# Patient Record
Sex: Male | Born: 1970
Health system: Southern US, Community
[De-identification: ages and names within clinical notes are randomized; demographics above are authoritative.]

## PROBLEM LIST (undated history)

## (undated) DIAGNOSIS — IMO0002 Reserved for concepts with insufficient information to code with codable children: Secondary | ICD-10-CM

## (undated) DIAGNOSIS — R229 Localized swelling, mass and lump, unspecified: Secondary | ICD-10-CM

## (undated) DIAGNOSIS — C801 Malignant (primary) neoplasm, unspecified: Secondary | ICD-10-CM

## (undated) DIAGNOSIS — K219 Gastro-esophageal reflux disease without esophagitis: Secondary | ICD-10-CM

## (undated) HISTORY — DX: Localized swelling, mass and lump, unspecified: R22.9

## (undated) HISTORY — DX: Malignant (primary) neoplasm, unspecified: C80.1

## (undated) HISTORY — PX: EYE SURGERY: SHX253

## (undated) HISTORY — DX: Reserved for concepts with insufficient information to code with codable children: IMO0002

## (undated) HISTORY — DX: Gastro-esophageal reflux disease without esophagitis: K21.9

---

## 1992-06-16 HISTORY — PX: INGUINAL HERNIA REPAIR: SUR1180

## 2007-06-17 DIAGNOSIS — C801 Malignant (primary) neoplasm, unspecified: Secondary | ICD-10-CM

## 2007-06-17 HISTORY — PX: EYE SURGERY: SHX253

## 2007-06-17 HISTORY — DX: Malignant (primary) neoplasm, unspecified: C80.1

## 2011-08-12 ENCOUNTER — Other Ambulatory Visit: Payer: Self-pay | Admitting: Unknown Physician Specialty

## 2011-08-12 ENCOUNTER — Ambulatory Visit
Admission: RE | Admit: 2011-08-12 | Discharge: 2011-08-12 | Disposition: A | Discharge status: Home / Self Care | Payer: Self-pay | Source: Ambulatory Visit | Attending: Unknown Physician Specialty | Admitting: Unknown Physician Specialty

## 2011-08-12 DIAGNOSIS — IMO0002 Reserved for concepts with insufficient information to code with codable children: Secondary | ICD-10-CM

## 2011-08-14 ENCOUNTER — Encounter (INDEPENDENT_AMBULATORY_CARE_PROVIDER_SITE_OTHER): Payer: Self-pay | Admitting: General Surgery

## 2011-08-14 ENCOUNTER — Ambulatory Visit (INDEPENDENT_AMBULATORY_CARE_PROVIDER_SITE_OTHER): Payer: BC Managed Care – PPO | Admitting: General Surgery

## 2011-08-14 VITALS — BP 122/88 | HR 70 | Temp 97.9°F | Resp 18 | Ht 71.0 in | Wt 170.8 lb

## 2011-08-14 DIAGNOSIS — R222 Localized swelling, mass and lump, trunk: Secondary | ICD-10-CM

## 2011-08-14 NOTE — Patient Instructions (Signed)
You will be scheduled for excision of the small nodule of your left chest wall in the near future. We will do this under local anesthesia.

## 2011-08-14 NOTE — Progress Notes (Signed)
Patient ID: Darius Fox, male   DOB: [birth year], 41 y.o.   MRN: [id number]  Chief Complaint  Patient presents with  . Mass    Chest wall    HPI Darius Fox is a 41 y.o. male.  He is referred by Dr. Theadora Rama at [facility] forevaluation of a soft tissue mass of the left chest wall.  Significant history is that the patient was treated for melanoma of the right eye in [place] 2009. He received proton beam therapy and has lost 95% of his vision on the right. Recent eye exam one month ago in [place] was normal. He gets periodic chest x-ray, ultrasound of liver, and MRI of brain and these have all been normal.  2 weeks ago he noticed a tender slightly reddened nodule of his left chest wall. He took antibiotics and states that it is less tender and less swollen now, but still present and firm.. A chest x-ray was done which was normal. Because of his history of melanoma he is very concerned this could be a metastasis. HPI  Past Medical History  Diagnosis Date  . GERD (gastroesophageal reflux disease)   . Cancer 2009    eye - right (below retina)  . Mass     chest wall    Past Surgical History  Procedure Date  . Eye surgery 2009    due to cancer  . Hernia repair 1994    Family History  Problem Relation Age of Onset  . Cancer Paternal Grandfather     prostate    Social History History  Substance Use Topics  . Smoking status: Never Smoker   . Smokeless tobacco: Never Used  . Alcohol Use: Yes     occasional 2 -3 per week    No Known Allergies  Current Outpatient Prescriptions  Medication Sig Dispense Refill  . esomeprazole (NEXIUM) 40 MG capsule Take 40 mg by mouth as needed.        Review of Systems Review of Systems  Constitutional: Negative for fever, chills and unexpected weight change.  HENT: Negative for hearing loss, congestion, sore throat, trouble swallowing and voice change.   Eyes: Positive for visual disturbance.  Respiratory:  Negative for cough and wheezing.   Cardiovascular: Negative for chest pain, palpitations and leg swelling.  Gastrointestinal: Negative for nausea, vomiting, abdominal pain, diarrhea, constipation, blood in stool, abdominal distention, anal bleeding and rectal pain.  Genitourinary: Negative for hematuria and difficulty urinating.  Musculoskeletal: Negative for arthralgias.  Skin: Negative for rash and wound.  Neurological: Negative for seizures, syncope, weakness and headaches.  Hematological: Negative for adenopathy. Does not bruise/bleed easily.  Psychiatric/Behavioral: Negative for confusion.    Blood pressure 122/88, pulse 70, temperature 97.9 F (36.6 C), temperature source Temporal, resp. rate 18, height 5\' 11"  (1.803 m), weight 170 lb 12.8 oz (77.474 kg).  Physical Exam Physical Exam  Constitutional: He is oriented to person, place, and time. He appears well-developed and well-nourished. No distress.  HENT:  Head: Normocephalic.  Nose: Nose normal.  Eyes: Conjunctivae and EOM are normal. Pupils are equal, round, and reactive to light. Right eye exhibits no discharge. Left eye exhibits no discharge. No scleral icterus.       Vision not tested.  Neck: Normal range of motion. Neck supple. No JVD present. No tracheal deviation present. No thyromegaly present.  Cardiovascular: Normal rate, regular rhythm, normal heart sounds and intact distal pulses.   No murmur heard. Pulmonary/Chest: Effort normal and breath sounds normal. No  stridor. No respiratory distress. He has no wheezes. He has no rales. He exhibits no tenderness.  Musculoskeletal: Normal range of motion. He exhibits no edema and no tenderness.  Lymphadenopathy:    He has no cervical adenopathy.  Neurological: He is alert and oriented to person, place, and time. He has normal reflexes. Coordination normal.  Skin: Skin is warm and dry. No rash noted. He is not diaphoretic. No erythema. No pallor.       There is an 8-10 cm  firm, subcutaneous nodule at the left lateral chest wall overlying the lower rib cage. Minimal skin discoloration. This is mobile and not fixed to the chest wall. No adenopathy.  Psychiatric: He has a normal mood and affect. His behavior is normal. Judgment and thought content normal.    Data Reviewed I reviewed his chest x-ray report and some handwritten medical notes from [facility].  Assessment    Soft tissue mass left chest wall. History is more consistent with inflammatory rather than neoplastic process. However, considering the history of melanoma this area should be excised for histologic confirmation.  History melanoma right eye, treated with proton beam therapy 2009    Plan    Schedule for excision of left chest wall mass under local anesthesia at [facility] day surgery in the very near future.  I have discussed the indications and details and risks of the surgery with the patient. He understands these issues. His questions are answered. He agrees with this plan.       Angelia Mould. Derrell Lolling, M.D., [facility] General and Minimally invasive Surgery Breast and Colorectal Surgery Office:   [phone] Pager:   [phone]  08/14/2011, 11:36 AM

## 2011-08-28 NOTE — H&P (Signed)
Darius Fox     MRN: [id number]   Description: 41 year old male  Provider: Ernestene Mention, MD  Department: [facility]        Diagnoses     Mass of chest wall   - Primary    786.6   Vitals     BP Pulse Temp(Src) Resp Ht Wt    122/88  70  97.9 F (36.6 C) (Temporal)  18  5\' 11"  (1.803 m)  170 lb 12.8 oz (77.474 kg)    BMI - 23.82 kg/m2                 History and Physical     Ernestene Mention, MD  Patient ID: Darius Fox, male   DOB: [birth year], 41 y.o.   MRN: [id number]       HPI Darius Fox is a 41 y.o. male.  He is referred by Dr. Theadora Rama at [facility] forevaluation of a soft tissue mass of the left chest wall.  Significant history is that the patient was treated for melanoma of the right eye in [place] 2009. He received proton beam therapy and has lost 95% of his vision on the right. Recent eye exam one month ago in [place] was normal. He gets periodic chest x-ray, ultrasound of liver, and MRI of brain and these have all been normal.  2 weeks ago he noticed a tender slightly reddened nodule of his left chest wall. He took antibiotics and states that it is less tender and less swollen now, but still present and firm.. A chest x-ray was done which was normal. Because of his history of melanoma he is very concerned this could be a metastasis.     Past Medical History   Diagnosis  Date   .  GERD (gastroesophageal reflux disease)     .  Cancer  2009       eye - right (below retina)   .  Mass         chest wall       Past Surgical History   Procedure  Date   .  Eye surgery  2009       due to cancer   .  Hernia repair  1994       Family History   Problem  Relation  Age of Onset   .  Cancer  Paternal Grandfather         prostate      Social History History   Substance Use Topics   .  Smoking status:  Never Smoker    .  Smokeless tobacco:  Never Used   .  Alcohol Use:  Yes         occasional 2 -3 per week       No Known Allergies    Current Outpatient Prescriptions   Medication  Sig  Dispense  Refill   .  esomeprazole (NEXIUM) 40 MG capsule  Take 40 mg by mouth as needed.            Review of Systems   Constitutional: Negative for fever, chills and unexpected weight change.  HENT: Negative for hearing loss, congestion, sore throat, trouble swallowing and voice change.   Eyes: Positive for visual disturbance.  Respiratory: Negative for cough and wheezing.   Cardiovascular: Negative for chest pain, palpitations and leg swelling.  Gastrointestinal: Negative for nausea, vomiting, abdominal pain, diarrhea, constipation, blood in stool, abdominal distention, anal bleeding and rectal pain.  Genitourinary: Negative for hematuria and difficulty urinating.  Musculoskeletal: Negative for arthralgias.  Skin: Negative for rash and wound.  Neurological: Negative for seizures, syncope, weakness and headaches.  Hematological: Negative for adenopathy. Does not bruise/bleed easily.  Psychiatric/Behavioral: Negative for confusion.    Blood pressure 122/88, pulse 70, temperature 97.9 F (36.6 C), temperature source Temporal, resp. rate 18, height 5\' 11"  (1.803 m), weight 170 lb 12.8 oz (77.474 kg).   Physical Exam  Constitutional: He is oriented to person, place, and time. He appears well-developed and well-nourished. No distress.  HENT:   Head: Normocephalic.   Nose: Nose normal.  Eyes: Conjunctivae and EOM are normal. Pupils are equal, round, and reactive to light. Right eye exhibits no discharge. Left eye exhibits no discharge. No scleral icterus.       Vision not tested.  Neck: Normal range of motion. Neck supple. No JVD present. No tracheal deviation present. No thyromegaly present.  Cardiovascular: Normal rate, regular rhythm, normal heart sounds and intact distal pulses.    No murmur heard. Pulmonary/Chest: Effort normal and breath sounds normal. No stridor. No respiratory distress. He has  no wheezes. He has no rales. He exhibits no tenderness.  Musculoskeletal: Normal range of motion. He exhibits no edema and no tenderness.  Lymphadenopathy:    He has no cervical adenopathy.  Neurological: He is alert and oriented to person, place, and time. He has normal reflexes. Coordination normal.  Skin: Skin is warm and dry. No rash noted. He is not diaphoretic. No erythema. No pallor.       There is an 8-10 cm firm, subcutaneous nodule at the left lateral chest wall overlying the lower rib cage. Minimal skin discoloration. This is mobile and not fixed to the chest wall. No adenopathy.  Psychiatric: He has a normal mood and affect. His behavior is normal. Judgment and thought content normal.    Data Reviewed I reviewed his chest x-ray report and some handwritten medical notes from [facility].   Assessment Soft tissue mass left chest wall. History is more consistent with inflammatory rather than neoplastic process. However, considering the history of melanoma this area should be excised for histologic confirmation.  History melanoma right eye, treated with proton beam therapy 2009   Plan Schedule for excision of left chest wall mass under local anesthesia at [facility] day surgery in the very near future.  I have discussed the indications and details and risks of the surgery with the patient. He understands these issues. His questions are answered. He agrees with this plan.       Angelia Mould. Derrell Lolling, M.D., [facility] General and Minimally invasive Surgery Breast and Colorectal Surgery Office:   [phone] Pager:   [phone]

## 2011-08-29 ENCOUNTER — Ambulatory Visit (HOSPITAL_BASED_OUTPATIENT_CLINIC_OR_DEPARTMENT_OTHER)
Admission: RE | Admit: 2011-08-29 | Discharge: 2011-08-29 | Disposition: A | Discharge status: Home / Self Care | Payer: BC Managed Care – PPO | Source: Ambulatory Visit | Attending: General Surgery | Admitting: General Surgery

## 2011-08-29 ENCOUNTER — Encounter (HOSPITAL_BASED_OUTPATIENT_CLINIC_OR_DEPARTMENT_OTHER)
Admission: RE | Disposition: A | Discharge status: Home / Self Care | Payer: Self-pay | Source: Ambulatory Visit | Attending: General Surgery

## 2011-08-29 ENCOUNTER — Encounter (HOSPITAL_BASED_OUTPATIENT_CLINIC_OR_DEPARTMENT_OTHER): Payer: Self-pay | Admitting: *Deleted

## 2011-08-29 DIAGNOSIS — K219 Gastro-esophageal reflux disease without esophagitis: Secondary | ICD-10-CM | POA: Insufficient documentation

## 2011-08-29 DIAGNOSIS — M799 Soft tissue disorder, unspecified: Secondary | ICD-10-CM | POA: Insufficient documentation

## 2011-08-29 DIAGNOSIS — D367 Benign neoplasm of other specified sites: Secondary | ICD-10-CM

## 2011-08-29 DIAGNOSIS — Z85820 Personal history of malignant melanoma of skin: Secondary | ICD-10-CM | POA: Insufficient documentation

## 2011-08-29 HISTORY — PX: MASS EXCISION: SHX2000

## 2011-08-29 SURGERY — MINOR EXCISION OF MASS
Anesthesia: LOCAL | Site: Chest | Laterality: Left | Wound class: Clean

## 2011-08-29 MED ORDER — HYDROCODONE-ACETAMINOPHEN 5-325 MG PO TABS: 1.0000 | ORAL_TABLET | ORAL | Status: AC | PRN

## 2011-08-29 MED ORDER — SODIUM BICARBONATE 4 % IV SOLN
INTRAVENOUS | Status: DC | PRN
  Administered 2011-08-29: 09:00:00 via SUBCUTANEOUS

## 2011-08-29 SURGICAL SUPPLY — 36 items
BENZOIN TINCTURE PRP APPL 2/3 (GAUZE/BANDAGES/DRESSINGS) IMPLANT
BLADE SURG 15 STRL LF DISP TIS (BLADE) ×1 IMPLANT
BLADE SURG 15 STRL SS (BLADE) ×1
CLOTH BEACON ORANGE TIMEOUT ST (SAFETY) IMPLANT
COTTONBALL LRG STERILE PKG (GAUZE/BANDAGES/DRESSINGS) IMPLANT
DERMABOND ADVANCED (GAUZE/BANDAGES/DRESSINGS) ×1
DERMABOND ADVANCED .7 DNX12 (GAUZE/BANDAGES/DRESSINGS) ×1 IMPLANT
DRAPE SURG 17X23 STRL (DRAPES) IMPLANT
ELECT REM PT RETURN 9FT ADLT (ELECTROSURGICAL)
ELECTRODE REM PT RTRN 9FT ADLT (ELECTROSURGICAL) IMPLANT
GAUZE SPONGE 4X4 12PLY STRL LF (GAUZE/BANDAGES/DRESSINGS) IMPLANT
GLOVE ECLIPSE 6.5 STRL STRAW (GLOVE) ×4 IMPLANT
GLOVE EUDERMIC 7 POWDERFREE (GLOVE) ×2 IMPLANT
MARKER SKIN DUAL TIP RULER LAB (MISCELLANEOUS) ×2 IMPLANT
NDL SAFETY ECLIPSE 18X1.5 (NEEDLE) ×1 IMPLANT
NEEDLE HYPO 18GX1.5 SHARP (NEEDLE) ×1
NEEDLE HYPO 25X1 1.5 SAFETY (NEEDLE) ×2 IMPLANT
NS IRRIG 1000ML POUR BTL (IV SOLUTION) ×2 IMPLANT
PENCIL BUTTON HOLSTER BLD 10FT (ELECTRODE) IMPLANT
STRIP CLOSURE SKIN 1/2X4 (GAUZE/BANDAGES/DRESSINGS) IMPLANT
SUT MNCRL AB 3-0 PS2 18 (SUTURE) IMPLANT
SUT MON AB 4-0 PC3 18 (SUTURE) ×2 IMPLANT
SUT SILK 3 0 TIES 17X18 (SUTURE)
SUT SILK 3-0 18XBRD TIE BLK (SUTURE) IMPLANT
SUT VIC AB 3-0 SH 27 (SUTURE) ×1
SUT VIC AB 3-0 SH 27X BRD (SUTURE) ×1 IMPLANT
SUT VIC AB 4-0 P-3 18XBRD (SUTURE) IMPLANT
SUT VIC AB 4-0 P3 18 (SUTURE)
SUT VIC AB 5-0 P-3 18X BRD (SUTURE) IMPLANT
SUT VIC AB 5-0 P3 18 (SUTURE)
SUT VICRYL 3-0 CR8 SH (SUTURE) IMPLANT
SUT VICRYL AB 3 0 TIES (SUTURE) IMPLANT
SWABSTICK POVIDONE IODINE SNGL (MISCELLANEOUS) ×4 IMPLANT
SYR CONTROL 10ML LL (SYRINGE) ×2 IMPLANT
TOWEL OR 17X24 6PK STRL BLUE (TOWEL DISPOSABLE) IMPLANT
TRAY DSU PREP LF (CUSTOM PROCEDURE TRAY) IMPLANT

## 2011-08-29 NOTE — Discharge Instructions (Signed)
Keep the wound clean and dry for 36 hours. Thereafter you may take a quick shower. No swimming or submersion for 2 weeks.  You may drive a car. He may return to work. Avoid strenuous sports or any significant impact for 2 weeks.  Call Dr. Derrell Lolling at the the office([phone]) and make an appointment to see Dr. Derrell Lolling in 2 weeks.  We should be able to call for pathology report to you by next Tuesday.

## 2011-08-29 NOTE — Interval H&P Note (Signed)
History and Physical Interval Note:  08/29/2011 8:35 AM  Darius Fox  has presented today for surgery, with the diagnosis of left chest wall mass   The goals of treatment and the  various methods of treatment have been discussed with the patient and family. After consideration of risks, benefits and other options for treatment, the patient has consented to  Procedure(s) (LRB): MINOR EXCISION OF MASS LEFT CHEST WALL (Left) as a surgical intervention .  The patients' history has been reviewed today, patient examined today, no change in status, stable for surgery.  I have reviewed the patients' chart and labs.  Questions were answered to the patient's satisfaction.     Ernestene Mention

## 2011-08-29 NOTE — Op Note (Signed)
Patient Name:           Darius Fox   Date of Surgery:        08/29/2011  Pre op Diagnosis:      Soft tissue mass left chest wall  Post op Diagnosis:    Same   Procedure:                 Kris Hartmann is 1 cm soft tissue mass left chest wall, subcutaneous, layered closure.  Surgeon:                     Angelia Mould. Derrell Lolling, M.D., FACS  Assistant:                      none  Operative Indications:   Significant history is that the patient was treated for melanoma of the right eye in [place] 2009. He received proton beam therapy and has lost 95% of his vision on the right. Recent eye exam one month ago in [place] was normal. He gets periodic chest x-ray, ultrasound of liver, and MRI of brain and these have all been normal.  2 weeks ago he noticed a tender slightly reddened nodule of his left chest wall. He took antibiotics and states that it is less tender and less swollen now, but still present and firm.. A chest x-ray was done which was normal. Because of his history of melanoma he is very concerned this could be a metastasis. On exam he has 81 cm subcutaneous nodule that is white firm. The overlying skin skin has a minimal discoloration. The mass is mobile.   Operative Findings:       There was a small firm nodule in the subcutaneous tissue.  Procedure in Detail:          The patient was brought to room 4 at [facility]. The procedure was done under local anesthesia. The patient was placed in the right lateral decubitus position exposing the left chest wall. Surgical time out was performed. The left chest wall was prepped and draped in a sterile fashion. Marking pen was used to mark the location of the nodule then to draw a transverse elliptical incision. 1% Xylocaine with epinephrine was used as local infiltration anesthetic. Transverse elliptical incision was made around the nodule. The dissection was taken down all the way to the muscle fascia. The nodule was removed and sent to  pathology with the history attached. Hemostasis excellent. Subcutaneous tissue closed with 3-0 Vicryl sutures and skin closed with running subcuticular suture of 4-0 Monocryl and Dermabond. Patient tolerated procedure well. There were no complications. EBL 5 cc. Counts correct.     Angelia Mould. Derrell Lolling, M.D., FACS General and Minimally Invasive Surgery Breast and Colorectal Surgery  08/29/2011 8:59 AM

## 2011-09-01 ENCOUNTER — Encounter (HOSPITAL_BASED_OUTPATIENT_CLINIC_OR_DEPARTMENT_OTHER): Payer: Self-pay | Admitting: General Surgery

## 2011-09-02 ENCOUNTER — Telehealth (INDEPENDENT_AMBULATORY_CARE_PROVIDER_SITE_OTHER): Payer: Self-pay

## 2011-09-02 NOTE — Telephone Encounter (Signed)
LMOM for pt to call. I have path result available.

## 2011-09-03 ENCOUNTER — Telehealth (INDEPENDENT_AMBULATORY_CARE_PROVIDER_SITE_OTHER): Payer: Self-pay

## 2011-09-03 NOTE — Telephone Encounter (Signed)
Pt returned call. Pt advised path benign. Pt to keep po appt.

## 2011-09-08 ENCOUNTER — Ambulatory Visit (INDEPENDENT_AMBULATORY_CARE_PROVIDER_SITE_OTHER): Payer: BC Managed Care – PPO | Admitting: General Surgery

## 2011-09-08 ENCOUNTER — Encounter (INDEPENDENT_AMBULATORY_CARE_PROVIDER_SITE_OTHER): Payer: Self-pay | Admitting: General Surgery

## 2011-09-08 VITALS — BP 114/70 | HR 66 | Temp 96.8°F | Resp 18 | Ht 71.0 in | Wt 168.6 lb

## 2011-09-08 DIAGNOSIS — Z9889 Other specified postprocedural states: Secondary | ICD-10-CM

## 2011-09-08 NOTE — Progress Notes (Signed)
Subjective:     Patient ID: Darius Fox, male   DOB: 02/04/1971, 41 y.o.   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 Final pathology report showed benign fibrosis. Traumatic etiology is favored. Nothing further will need to be done. I gave him a copy of his pathology report and discussed this with him. He has no problems with wound healing.  Review of Systems     Objective:   Physical Exam The patient looks well. No distress.  Left chest wall incision is healing uneventfully. No wound complications.   Assessment:     Benign fibrotic nodule left chest wall, recovering uneventfully following excisional biopsy.  History of ocular melanoma.    Plan:     The patient was given a copy of his pathology report.  Wound care discussed.  Return to see me p.r.n.   Angelia Mould. Derrell Lolling, M.D., [facility] General and Minimally invasive Surgery Breast and Colorectal Surgery Office:   [phone] Pager:   [phone]

## 2011-09-08 NOTE — Patient Instructions (Signed)
The final pathology report from the biopsy of your left chest wall shows a benign scarring or fibrosis. There is no evidence of any malignancy.  Return to see Dr. Derrell Lolling if any new problems arise.

## 2012-07-23 ENCOUNTER — Other Ambulatory Visit: Payer: Self-pay | Admitting: Family Medicine

## 2012-07-23 DIAGNOSIS — C439 Malignant melanoma of skin, unspecified: Secondary | ICD-10-CM

## 2012-08-04 ENCOUNTER — Ambulatory Visit
Admission: RE | Admit: 2012-08-04 | Discharge: 2012-08-04 | Disposition: A | Discharge status: Home / Self Care | Payer: BC Managed Care – PPO | Source: Ambulatory Visit | Attending: Family Medicine | Admitting: Family Medicine

## 2012-08-04 DIAGNOSIS — C439 Malignant melanoma of skin, unspecified: Secondary | ICD-10-CM

## 2013-07-28 ENCOUNTER — Encounter: Payer: Self-pay | Admitting: Gastroenterology

## 2013-08-30 ENCOUNTER — Encounter: Payer: Self-pay | Admitting: Gastroenterology

## 2013-08-30 ENCOUNTER — Ambulatory Visit (INDEPENDENT_AMBULATORY_CARE_PROVIDER_SITE_OTHER): Payer: BC Managed Care – PPO | Admitting: Gastroenterology

## 2013-08-30 VITALS — BP 110/76 | HR 76 | Ht 71.0 in | Wt 179.1 lb

## 2013-08-30 DIAGNOSIS — K219 Gastro-esophageal reflux disease without esophagitis: Secondary | ICD-10-CM

## 2013-08-30 NOTE — Progress Notes (Signed)
HPI: This is a  very pleasant 43 year old man born in [place] who lives here, works for [employer].    He has had chronic heartburn for 20 years or so. He had an upper endoscopy 6 years ago when he was first diagnosed with ocular melanoma he tells me he was normal. He takes proton pump inhibitor once daily and on that regimen, which is an over-the-counter strength Nexium, his symptoms are completely controlled without any alarm symptoms. He has no dysphasia, no overt, no unexplained weight loss. His GERD symptoms that typical of pyrosis.    Review of systems: Pertinent positive and negative review of systems were noted in the above HPI section. Complete review of systems was performed and was otherwise normal.    Past Medical History  Diagnosis Date  . GERD (gastroesophageal reflux disease)   . Cancer 2009    eye - right (below retina)  . Mass     chest wall    Past Surgical History  Procedure Laterality Date  . Eye surgery  2009    due to cancer  . Inguinal hernia repair Right 1994  . Mass excision  08/29/2011    Procedure: MINOR EXCISION OF MASS;  Surgeon: Adin Hector, MD;  Location: [facility];  Service: General;  Laterality: Left;  local excision of left chest wall mass     Current Outpatient Prescriptions  Medication Sig Dispense Refill  . esomeprazole (NEXIUM) 20 MG capsule Take 20 mg by mouth daily at 12 noon.       No current facility-administered medications for this visit.    Allergies as of 08/30/2013  . (No Known Allergies)    Family History  Problem Relation Age of Onset  . Prostate cancer Paternal Grandfather   . Diabetes Mother   . Hypertension Father     History   Social History  . Marital Status: Married    Spouse Name: N/A    Number of Children: 1  . Years of Education: N/A   Occupational History  . Director of [employer]    Social History Main Topics  . Smoking status: Never Smoker   . Smokeless tobacco:  Never Used  . Alcohol Use: Yes     Comment: occasional 2 -3 per week  . Drug Use: No  . Sexual Activity: Not on file   Other Topics Concern  . Not on file   Social History Narrative  . No narrative on file       Physical Exam: BP 110/76  Pulse 76  Ht 5\' 11"  (1.803 m)  Wt 179 lb 2 oz (81.251 kg)  BMI 24.99 kg/m2 Constitutional: generally well-appearing Psychiatric: alert and oriented x3 Eyes: extraocular movements intact Mouth: oral pharynx moist, no lesions Neck: supple no lymphadenopathy Cardiovascular: heart regular rate and rhythm Lungs: clear to auscultation bilaterally Abdomen: soft, nontender, nondistended, no obvious ascites, no peritoneal signs, normal bowel sounds Extremities: no lower extremity edema bilaterally Skin: no lesions on visible extremities    Assessment and plan: 43 y.o. male with  chronic GERD without alarm symptoms in a nonsmoking male   He will continue on proton pump inhibitor once daily. He knows to call if this ceases to control his symptoms. He has no alarm symptoms currently. He had a colonoscopy 6 years ago, this was around the time of being diagnosed with ocular melanoma. He will be turning 48 in about 5 a half years and we will put him in our reminder system  for recall colonoscopy around that time for routine risk.

## 2013-08-30 NOTE — Patient Instructions (Signed)
Continue taking nexium (PPI) once daily for your GERD symptoms. Colonoscopy for colon cancer screening at age 43, recall colonoscopy 5 years.

## 2013-11-08 ENCOUNTER — Other Ambulatory Visit: Payer: Self-pay | Admitting: Unknown Physician Specialty

## 2013-11-08 DIAGNOSIS — C439 Malignant melanoma of skin, unspecified: Secondary | ICD-10-CM

## 2013-11-22 ENCOUNTER — Encounter (INDEPENDENT_AMBULATORY_CARE_PROVIDER_SITE_OTHER): Payer: Self-pay

## 2013-11-22 ENCOUNTER — Ambulatory Visit
Admission: RE | Admit: 2013-11-22 | Discharge: 2013-11-22 | Disposition: A | Discharge status: Home / Self Care | Payer: BC Managed Care – PPO | Source: Ambulatory Visit | Attending: Unknown Physician Specialty | Admitting: Unknown Physician Specialty

## 2013-11-22 ENCOUNTER — Other Ambulatory Visit: Payer: Self-pay | Admitting: Unknown Physician Specialty

## 2013-11-22 DIAGNOSIS — C439 Malignant melanoma of skin, unspecified: Secondary | ICD-10-CM

## 2014-02-21 ENCOUNTER — Other Ambulatory Visit (INDEPENDENT_AMBULATORY_CARE_PROVIDER_SITE_OTHER): Payer: Self-pay | Admitting: General Surgery

## 2014-02-21 ENCOUNTER — Encounter (INDEPENDENT_AMBULATORY_CARE_PROVIDER_SITE_OTHER): Payer: BC Managed Care – PPO | Admitting: General Surgery

## 2014-04-09 NOTE — H&P (Signed)
Darius Fox  Location: [facility] Patient #: [id number] DOB: [birth year] Married / Language: English / Race: White Male  History of Present Illness Patient words: Cyst on spine area and shoulder blade.  The patient is a 43 year old male who presents with an epidermal cyst. This gentleman is referred back to me by Dr. Hulan Fess for evaluation and management of 3 separate epidermal cysts on his back. They have been a little uncomfortable but have never been truly infected and had never really drained. They have been getting larger. His wife expressed some material from one of the lower ones. He wants all of these excised. Past history is significant for chronic reflux on Nexium. history of ocular melanoma followed at [facility] no recurrence I excised a benign lesion on his chest wall 3 years ago.   Other Problems  Gastroesophageal Reflux Disease Melanoma  Past Surgical History Open Inguinal Hernia Surgery Right.  Diagnostic Studies History  Colonoscopy 5-10 years ago  Allergies  No Known Drug Allergies09/01/2014  Medication History NexIUM (40MG  Capsule DR, Oral as needed) Active.  Social History Alcohol use Occasional alcohol use. Caffeine use Coffee. No drug use Tobacco use Never smoker.  Family History Hypertension Father.  Review of Systems General Not Present- Appetite Loss, Chills, Fatigue, Fever, Night Sweats, Weight Gain and Weight Loss. Skin Not Present- Change in Wart/Mole, Dryness, Hives, Jaundice, New Lesions, Non-Healing Wounds, Rash and Ulcer. HEENT Present- Ringing in the Ears and Wears glasses/contact lenses. Not Present- Earache, Hearing Loss, Hoarseness, Nose Bleed, Oral Ulcers, Seasonal Allergies, Sinus Pain, Sore Throat, Visual Disturbances and Yellow Eyes. Respiratory Not Present- Bloody sputum, Chronic Cough, Difficulty Breathing, Snoring and Wheezing. Breast Not Present- Breast Mass, Breast Pain, Nipple Discharge and Skin  Changes. Cardiovascular Not Present- Chest Pain, Difficulty Breathing Lying Down, Leg Cramps, Palpitations, Rapid Heart Rate, Shortness of Breath and Swelling of Extremities. Gastrointestinal Not Present- Abdominal Pain, Bloating, Bloody Stool, Change in Bowel Habits, Chronic diarrhea, Constipation, Difficulty Swallowing, Excessive gas, Gets full quickly at meals, Hemorrhoids, Indigestion, Nausea, Rectal Pain and Vomiting. Male Genitourinary Not Present- Blood in Urine, Change in Urinary Stream, Frequency, Impotence, Nocturia, Painful Urination, Urgency and Urine Leakage. Musculoskeletal Not Present- Back Pain, Joint Pain, Joint Stiffness, Muscle Pain, Muscle Weakness and Swelling of Extremities. Neurological Not Present- Decreased Memory, Fainting, Headaches, Numbness, Seizures, Tingling, Tremor, Trouble walking and Weakness. Psychiatric Not Present- Anxiety, Bipolar, Change in Sleep Pattern, Depression, Fearful and Frequent crying. Endocrine Not Present- Cold Intolerance, Excessive Hunger, Hair Changes, Heat Intolerance, Hot flashes and New Diabetes. Hematology Not Present- Easy Bruising, Excessive bleeding, Gland problems, HIV and Persistent Infections.   Vitals 02/21/2014 2:21 PM Weight: 173.2 lb Height: 71in Body Surface Area: 1.98 m Body Mass Index: 24.16 kg/m Temp.: 98.110F(Oral)  Pulse: 80 (Regular)  Resp.: 20 (Unlabored)  BP: 120/84 (Sitting, Left Arm, Standard)    Physical Exam  General Note: Alert. Thin. Healthy gentleman in no distress.   Integumentary Note: There are 3 epidermal cysts on his back. There is a 2 cm cyst in the upper left paraspinal area. There are 2 epidermal cyst in the right mid to lower back, one of these is a little bit reddened but doesn't look obviously infected it is not tender.   Head and Neck Note: No adenopathy or mass. No thyromegaly.   Chest and Lung Exam Note: Lungs are clear to auscultation  bilaterally   Cardiovascular Note: heart reveals regular rate and rhythm. No murmur. No ectopy. Radial pulses palpable.  Musculoskeletal Note: Angulating independently. No gross motor or sensory deficits. Gait normal.     Assessment & Plan  EPIDERMAL CYST (706.2  L72.0) Impression: Excision of these 3 cysts is desired by the patient. He is concerned about enlargement and intermittent discomfort. He will be scheduled for excision of all 3 epidermal cyst of his back under local anesthesia in the near future. Indications, details, technique, and risk were discussed and accepted. Current Plans  Schedule for Surgery  [facility]. Dalbert Batman, M.D., [facility] General and Minimally invasive Surgery Breast and Colorectal Surgery Office:   [phone] Pager:   [phone]

## 2014-04-11 ENCOUNTER — Ambulatory Visit (HOSPITAL_BASED_OUTPATIENT_CLINIC_OR_DEPARTMENT_OTHER)
Admission: RE | Admit: 2014-04-11 | Discharge: 2014-04-11 | Disposition: A | Discharge status: Home / Self Care | Payer: BC Managed Care – PPO | Source: Ambulatory Visit | Attending: General Surgery | Admitting: General Surgery

## 2014-04-11 ENCOUNTER — Encounter (HOSPITAL_BASED_OUTPATIENT_CLINIC_OR_DEPARTMENT_OTHER)
Admission: RE | Disposition: A | Discharge status: Home / Self Care | Payer: Self-pay | Source: Ambulatory Visit | Attending: General Surgery

## 2014-04-11 ENCOUNTER — Encounter (HOSPITAL_BASED_OUTPATIENT_CLINIC_OR_DEPARTMENT_OTHER): Payer: Self-pay | Admitting: *Deleted

## 2014-04-11 ENCOUNTER — Encounter (HOSPITAL_BASED_OUTPATIENT_CLINIC_OR_DEPARTMENT_OTHER): Payer: Self-pay | Admitting: Anesthesiology

## 2014-04-11 DIAGNOSIS — K219 Gastro-esophageal reflux disease without esophagitis: Secondary | ICD-10-CM | POA: Insufficient documentation

## 2014-04-11 DIAGNOSIS — Z85820 Personal history of malignant melanoma of skin: Secondary | ICD-10-CM | POA: Diagnosis not present

## 2014-04-11 DIAGNOSIS — L720 Epidermal cyst: Secondary | ICD-10-CM

## 2014-04-11 HISTORY — PX: MASS EXCISION: SHX2000

## 2014-04-11 SURGERY — MINOR EXCISION OF MASS
Anesthesia: LOCAL | Site: Back

## 2014-04-11 MED ORDER — SODIUM BICARBONATE 4 % IV SOLN
INTRAVENOUS | Status: AC
  Filled 2014-04-11: qty 5

## 2014-04-11 MED ORDER — BUPIVACAINE-EPINEPHRINE (PF) 0.5% -1:200000 IJ SOLN
INTRAMUSCULAR | Status: AC
  Filled 2014-04-11: qty 60

## 2014-04-11 MED ORDER — SODIUM BICARBONATE 4 % IV SOLN
INTRAVENOUS | Status: DC | PRN
  Administered 2014-04-11: 10:00:00 via INTRAMUSCULAR

## 2014-04-11 MED ORDER — CHLORHEXIDINE GLUCONATE 4 % EX LIQD: 1.0000 "application " | Freq: Once | CUTANEOUS | Status: DC

## 2014-04-11 MED ORDER — HYDROCODONE-ACETAMINOPHEN 5-325 MG PO TABS: 1.0000 | ORAL_TABLET | Freq: Four times a day (QID) | ORAL | Status: DC | PRN

## 2014-04-11 SURGICAL SUPPLY — 38 items
BENZOIN TINCTURE PRP APPL 2/3 (GAUZE/BANDAGES/DRESSINGS) IMPLANT
BLADE SURG 15 STRL LF DISP TIS (BLADE) ×1 IMPLANT
BLADE SURG 15 STRL SS (BLADE) ×1
COTTONBALL LRG STERILE PKG (GAUZE/BANDAGES/DRESSINGS) IMPLANT
DRAPE SURG 17X23 STRL (DRAPES) IMPLANT
ELECT REM PT RETURN 9FT ADLT (ELECTROSURGICAL)
ELECTRODE REM PT RTRN 9FT ADLT (ELECTROSURGICAL) IMPLANT
GLOVE BIO SURGEON STRL SZ 6.5 (GLOVE) ×2 IMPLANT
GLOVE BIOGEL PI IND STRL 7.0 (GLOVE) ×1 IMPLANT
GLOVE BIOGEL PI INDICATOR 7.0 (GLOVE) ×1
GLOVE EUDERMIC 7 POWDERFREE (GLOVE) ×2 IMPLANT
GOWN STRL REUS W/ TWL LRG LVL3 (GOWN DISPOSABLE) ×1 IMPLANT
GOWN STRL REUS W/ TWL XL LVL3 (GOWN DISPOSABLE) ×1 IMPLANT
GOWN STRL REUS W/TWL LRG LVL3 (GOWN DISPOSABLE) ×1
GOWN STRL REUS W/TWL XL LVL3 (GOWN DISPOSABLE) ×1
LIQUID BAND (GAUZE/BANDAGES/DRESSINGS) ×2 IMPLANT
MARKER SKIN DUAL TIP RULER LAB (MISCELLANEOUS) ×2 IMPLANT
NDL SAFETY ECLIPSE 18X1.5 (NEEDLE) IMPLANT
NEEDLE HYPO 18GX1.5 SHARP (NEEDLE)
NEEDLE HYPO 25X1 1.5 SAFETY (NEEDLE) ×2 IMPLANT
NS IRRIG 1000ML POUR BTL (IV SOLUTION) IMPLANT
PENCIL BUTTON HOLSTER BLD 10FT (ELECTRODE) IMPLANT
SPONGE GAUZE 4X4 12PLY STER LF (GAUZE/BANDAGES/DRESSINGS) IMPLANT
STRIP CLOSURE SKIN 1/2X4 (GAUZE/BANDAGES/DRESSINGS) IMPLANT
SUT MNCRL AB 3-0 PS2 18 (SUTURE) IMPLANT
SUT MNCRL AB 4-0 PS2 18 (SUTURE) ×2 IMPLANT
SUT SILK 3 0 TIES 17X18 (SUTURE)
SUT SILK 3-0 18XBRD TIE BLK (SUTURE) IMPLANT
SUT VIC AB 4-0 P-3 18XBRD (SUTURE) IMPLANT
SUT VIC AB 4-0 P3 18 (SUTURE)
SUT VIC AB 5-0 P-3 18X BRD (SUTURE) IMPLANT
SUT VIC AB 5-0 P3 18 (SUTURE)
SUT VICRYL 3-0 CR8 SH (SUTURE) ×2 IMPLANT
SUT VICRYL AB 3 0 TIES (SUTURE) IMPLANT
SWABSTICK POVIDONE IODINE SNGL (MISCELLANEOUS) IMPLANT
SYRINGE 10CC LL (SYRINGE) ×2 IMPLANT
TOWEL OR 17X24 6PK STRL BLUE (TOWEL DISPOSABLE) IMPLANT
TRAY DSU PREP LF (CUSTOM PROCEDURE TRAY) IMPLANT

## 2014-04-11 NOTE — Interval H&P Note (Signed)
History and Physical Interval Note:  04/11/2014 8:51 AM  Darius Fox  has presented today for surgery, with the diagnosis of multiple epidermoid cysts of back  The various methods of treatment have been discussed with the patient and family. After consideration of risks, benefits and other options for treatment, the patient has consented to  Procedure(s): MINOR EXCISION OF EPIDERMOID CYSTS FROM BACK X3 (N/A) as a surgical intervention .  The patient's history has been reviewed, patient examined today, no change in status, stable for surgery.  I have reviewed the patient's chart and labs.  Questions were answered to the patient's satisfaction.     Adin Hector

## 2014-04-11 NOTE — Op Note (Signed)
Patient Name:           Darius Fox   Date of Surgery:        04/11/2014  Pre op Diagnosis:      2 cm epidermoid cyst of upper back 1.5 cm epidermoid cyst of right mid back  Post op Diagnosis:    Same  Procedure:                 Excision 2 cm epidermoid cyst of upper back, excision 1.5 cm epidermoid cyst of right mid back  Surgeon:                     Edsel Petrin. Dalbert Batman, M.D., FACS  Assistant:                      None  Operative Indications:   The patient is a 42 year old male who presents with an epidermal cyst. This gentleman is referred back to me by Dr. Hulan Fess for evaluation and management of 2 separate epidermal cysts on his back. They have been a little uncomfortable but have never been truly infected and had never really drained. They have been getting larger. His wife expressed some material from one of the lower ones. He wants all of these excised. Past history is significant for chronic reflux on Nexium. history of ocular melanoma followed at [facility] no recurrence I excised a benign lesion on his chest wall 3 years ago.   Operative Findings:       In the upper central back there is a 2.0 cm subcutaneous mass consistent with a chronic epidermoid cyst. In the right mid back, paraspinal area, there is a 1.5 cm septated use mass consistent with chronic epidermoid cyst. Neither of these appear acutely infected.  Procedure in Detail:          The patient was brought to room to [facility] and placed prone  with appropriate cushioning. The back was prepped and draped in a sterile fashion. Surgical timeout was performed. 1% Xylocaine with epinephrine and sodium bicarbonate was used as local infiltration anesthetic.    In the upper back I made a 2.5 cm transverse elliptical incision and dissected out what appeared to be a chronic epidermoid cyst. This was sent to pathology. In the right mid back paraspinous area made a 2.0 cm transverse elliptical incision excising it  appeared to be a chronic epidermoid cyst. Sent to pathology.  Both of these wounds were closed in layers, deeper tissues with 3-0 Vicryl sutures and skin with subcuticular 4-0 Monocryl and Dermabond. Hemostasis was excellent. Patient tolerated procedure well. Return to preop area in excellent condition. EBL 10 mL or less. Counts correct. Complications none.     Edsel Petrin. Dalbert Batman, M.D., FACS General and Minimally Invasive Surgery Breast and Colorectal Surgery  04/11/2014 9:52 AM

## 2014-04-11 NOTE — Discharge Instructions (Signed)
You may take a shower starting tomorrow. No tub baths for 10 days.  No contact sports for 2-3 weeks  The clear plastic Dermabond superglue should wear off in about 3 weeks.  The pathology report should be available by Friday. Give [place] a call and we will get that information to you. These look like benign cysts.  If the wound is heals perfectly well, then you do not need to return to the office.  If there are any wound problems whatsoever, please call and we will see you in the office any time.

## 2014-04-12 NOTE — Progress Notes (Signed)
Quick Note:  Inform patient of Pathology report,. Benign epidermal cysts, as expected.  hmi ______

## 2014-04-13 ENCOUNTER — Encounter (HOSPITAL_BASED_OUTPATIENT_CLINIC_OR_DEPARTMENT_OTHER): Payer: Self-pay | Admitting: General Surgery

## 2014-04-13 ENCOUNTER — Telehealth (INDEPENDENT_AMBULATORY_CARE_PROVIDER_SITE_OTHER): Payer: Self-pay

## 2014-04-13 NOTE — Telephone Encounter (Signed)
Message copied by Dois Davenport on Thu Apr 13, 2014  9:35 AM ------      Message from: Darius Fox      Created: Wed Apr 12, 2014 12:49 PM       Inform patient of Pathology report,. Benign epidermal cysts, as expected.            hmi ------

## 2014-04-13 NOTE — Telephone Encounter (Signed)
Per Dr Darrel Hoover request pt notified of path result. Per pts request po appt cx. Pt states he was advised by Dr Dalbert Batman if bx benign to f/u needed.

## 2014-12-07 ENCOUNTER — Encounter: Payer: Self-pay | Admitting: Podiatry

## 2014-12-07 ENCOUNTER — Ambulatory Visit (INDEPENDENT_AMBULATORY_CARE_PROVIDER_SITE_OTHER): Payer: BLUE CROSS/BLUE SHIELD

## 2014-12-07 ENCOUNTER — Ambulatory Visit (INDEPENDENT_AMBULATORY_CARE_PROVIDER_SITE_OTHER): Payer: BLUE CROSS/BLUE SHIELD | Admitting: Podiatry

## 2014-12-07 ENCOUNTER — Ambulatory Visit: Payer: BLUE CROSS/BLUE SHIELD

## 2014-12-07 VITALS — BP 124/79 | HR 69 | Temp 99.5°F | Resp 12

## 2014-12-07 DIAGNOSIS — M79673 Pain in unspecified foot: Secondary | ICD-10-CM

## 2014-12-07 DIAGNOSIS — M6740 Ganglion, unspecified site: Secondary | ICD-10-CM | POA: Diagnosis not present

## 2014-12-07 NOTE — Progress Notes (Signed)
   Subjective:    Patient ID: Darius Fox, male    DOB: 08-05-1970, 44 y.o.   MRN: [id number]  HPI Patient presents here today with knot on side of right foot which is painless, has been there for about  6 mos. Ago. He has not treated it with anything.   Review of Systems  All other systems reviewed and are negative.      Objective:   Physical Exam        Assessment & Plan:

## 2014-12-09 NOTE — Progress Notes (Signed)
Subjective:     Patient ID: Darius Fox, male   DOB: [birth year], 44 y.o.   MRN: [id number]  HPI patient states I have this large not on the side of my right foot which at times can be bothersome with certain shoes. Been there for around 6 months   Review of Systems  All other systems reviewed and are negative.      Objective:   Physical Exam  Constitutional: He is oriented to person, place, and time.  Cardiovascular: Intact distal pulses.   Musculoskeletal: Normal range of motion.  Neurological: He is oriented to person, place, and time.  Skin: Skin is warm.  Nursing note and vitals reviewed.  neurovascular status intact with muscle strength adequate range of motion within normal limits. Patient's noted to have a mass measuring about 2 cm x 2 cm on the lateral side of the right foot which appears to be freely movable within subcutaneous tissue area patient has good digital perfusion and is well oriented 3     Assessment:     Ganglionic cyst formation right foot that is painful when pressed    Plan:     H&P and condition reviewed with patient. I have recommended draining this and I did a proximal nerve block and then after appropriate anesthesia and sterile prep I aspirated the area getting out around one cc of gelatinous material and then injected a small amount of steroidal applied sterile dressing and compression. Instructed it may return and if it doesn't want to reevaluate

## 2015-09-13 ENCOUNTER — Encounter (HOSPITAL_COMMUNITY): Payer: Self-pay | Admitting: *Deleted

## 2015-09-13 ENCOUNTER — Emergency Department (HOSPITAL_COMMUNITY): Payer: BLUE CROSS/BLUE SHIELD

## 2015-09-13 ENCOUNTER — Emergency Department (HOSPITAL_COMMUNITY)
Admission: EM | Admit: 2015-09-13 | Discharge: 2015-09-13 | Disposition: A | Discharge status: Home / Self Care | Payer: BLUE CROSS/BLUE SHIELD | Attending: Emergency Medicine | Admitting: Emergency Medicine

## 2015-09-13 DIAGNOSIS — Z85840 Personal history of malignant neoplasm of eye: Secondary | ICD-10-CM | POA: Diagnosis not present

## 2015-09-13 DIAGNOSIS — R160 Hepatomegaly, not elsewhere classified: Secondary | ICD-10-CM | POA: Insufficient documentation

## 2015-09-13 DIAGNOSIS — Z8719 Personal history of other diseases of the digestive system: Secondary | ICD-10-CM | POA: Insufficient documentation

## 2015-09-13 DIAGNOSIS — R1033 Periumbilical pain: Secondary | ICD-10-CM | POA: Diagnosis present

## 2015-09-13 LAB — CBC
HCT: 43.9 % (ref 39.0–52.0)
Hemoglobin: 15.5 g/dL (ref 13.0–17.0)
MCH: 29.2 pg (ref 26.0–34.0)
MCHC: 35.3 g/dL (ref 30.0–36.0)
MCV: 82.7 fL (ref 78.0–100.0)
Platelets: 207 10*3/uL (ref 150–400)
RBC: 5.31 MIL/uL (ref 4.22–5.81)
RDW: 12.2 % (ref 11.5–15.5)
WBC: 11.2 10*3/uL — ABNORMAL HIGH (ref 4.0–10.5)

## 2015-09-13 LAB — COMPREHENSIVE METABOLIC PANEL
ALT: 29 U/L (ref 17–63)
AST: 23 U/L (ref 15–41)
Albumin: 3.8 g/dL (ref 3.5–5.0)
Alkaline Phosphatase: 52 U/L (ref 38–126)
Anion gap: 7 (ref 5–15)
BUN: 14 mg/dL (ref 6–20)
CHLORIDE: 104 mmol/L (ref 101–111)
CO2: 26 mmol/L (ref 22–32)
Calcium: 9.1 mg/dL (ref 8.9–10.3)
Creatinine, Ser: 0.83 mg/dL (ref 0.61–1.24)
GFR calc non Af Amer: >60 mL/min (ref >60)
Glucose, Bld: 99 mg/dL (ref 65–99)
POTASSIUM: 3.4 mmol/L — AB (ref 3.5–5.1)
SODIUM: 137 mmol/L (ref 135–145)
Total Bilirubin: 0.8 mg/dL (ref 0.3–1.2)
Total Protein: 6.4 g/dL — ABNORMAL LOW (ref 6.5–8.1)

## 2015-09-13 LAB — URINALYSIS, ROUTINE W REFLEX MICROSCOPIC
Bilirubin Urine: NEGATIVE
GLUCOSE, UA: NEGATIVE mg/dL
HGB URINE DIPSTICK: NEGATIVE
Ketones, ur: 15 mg/dL — AB
Leukocytes, UA: NEGATIVE
Nitrite: NEGATIVE
Protein, ur: NEGATIVE mg/dL
Specific Gravity, Urine: 1.038 — ABNORMAL HIGH (ref 1.005–1.030)
pH: 7 (ref 5.0–8.0)

## 2015-09-13 LAB — LIPASE, BLOOD: Lipase: 30 U/L (ref 11–51)

## 2015-09-13 MED ORDER — OXYCODONE HCL 5 MG PO TABS: 5.0000 mg | ORAL_TABLET | Freq: Four times a day (QID) | ORAL | Status: DC | PRN

## 2015-09-13 MED ORDER — IOHEXOL 300 MG/ML  SOLN
100.0000 mL | Freq: Once | INTRAMUSCULAR | Status: AC | PRN
  Administered 2015-09-13: 100 mL via INTRAVENOUS

## 2015-09-13 MED ORDER — SODIUM CHLORIDE 0.9 % IV BOLUS (SEPSIS)
1000.0000 mL | Freq: Once | INTRAVENOUS | Status: AC
  Administered 2015-09-13: 1000 mL via INTRAVENOUS

## 2015-09-13 MED ORDER — OXYCODONE HCL 5 MG PO TABS
5.0000 mg | ORAL_TABLET | Freq: Once | ORAL | Status: AC
  Administered 2015-09-13: 5 mg via ORAL
  Filled 2015-09-13: qty 1

## 2015-09-13 NOTE — ED Notes (Signed)
Pt departed in NAD.  

## 2015-09-13 NOTE — ED Notes (Signed)
Patient transported to CT 

## 2015-09-13 NOTE — Discharge Instructions (Signed)
Dr. Carin Hock oncology office will be calling to schedule an appointment for you next week. Dr. Irene Limbo is the name of the melanoma specialist with that practice. Please call the phone number in this discharge paperwork if you do not hear from them by Monday.

## 2015-09-13 NOTE — ED Notes (Signed)
Pt has right sided abdominal pain that started last nite and the pain is increasing.  No vomiting.

## 2015-09-13 NOTE — ED Provider Notes (Signed)
CSN: [id number]     Arrival date & time 09/13/15  1650 History   First MD Initiated Contact with Patient 09/13/15 1743     Chief Complaint  Patient presents with  . Abdominal Pain     HPI 45 year old previously healthy male presenting with abdominal pain. Patient reports waking up this morning with dull periumbilical abdominal pain that has since localized to the R side of the abdomen. Pain is described as a dull ache that is worse with deep inspiration and with certain movements. He endorses nausea but no vomiting. He has not had an appetite throughout the day today. Denies fevers. He does not have history of abdominal surgery and still has his gallbladder and appendix. Reports that he does have known small gallstones but has never been symptomatic from these. Denies chest pain, cough, shortness of breath, diarrhea, constipation, blood in his stools.  Past Medical History  Diagnosis Date  . GERD (gastroesophageal reflux disease)   . Cancer ([place]) 2009    eye - right (below retina)  . Mass     chest wall   Past Surgical History  Procedure Laterality Date  . Eye surgery  2009    due to cancer  . Inguinal hernia repair Right 1994  . Mass excision  08/29/2011    Procedure: MINOR EXCISION OF MASS;  Surgeon: Adin Hector, MD;  Location: [place];  Service: General;  Laterality: Left;  local excision of left chest wall mass   . Mass excision N/A 04/11/2014    Procedure: MINOR EXCISION OF EPIDERMOID CYSTS FROM BACK X2;  Surgeon: Fanny Skates, MD;  Location: [place];  Service: General;  Laterality: N/A;   Family History  Problem Relation Age of Onset  . Prostate cancer Paternal Grandfather   . Diabetes Mother   . Hypertension Father    Social History  Substance Use Topics  . Smoking status: Never Smoker   . Smokeless tobacco: Never Used  . Alcohol Use: Yes     Comment: occasional 2 -3 per week    Review of Systems  Constitutional: Positive  for appetite change. Negative for fever, chills and activity change.  HENT: Negative for congestion, facial swelling, rhinorrhea and sore throat.   Eyes: Negative for visual disturbance.  Respiratory: Negative for cough, shortness of breath and wheezing.   Cardiovascular: Negative for chest pain, palpitations and leg swelling.  Gastrointestinal: Positive for nausea and abdominal pain. Negative for vomiting, diarrhea, constipation, blood in stool and abdominal distention.  Genitourinary: Negative for dysuria, frequency, hematuria, flank pain and difficulty urinating.  Musculoskeletal: Negative for myalgias, back pain, joint swelling, arthralgias, neck pain and neck stiffness.  Skin: Negative for rash.  Neurological: Negative for dizziness, weakness, light-headedness and headaches.  Psychiatric/Behavioral: Negative for behavioral problems, confusion and agitation.      Allergies  Review of patient's allergies indicates no known allergies.  Home Medications   Prior to Admission medications   Medication Sig Start Date End Date Taking? Authorizing Provider  oxyCODONE (ROXICODONE) 5 MG immediate release tablet Take 1 tablet (5 mg total) by mouth every 6 (six) hours as needed for severe pain. 09/13/15   Jenifer Algernon Huxley, MD   BP 142/87 mmHg  Pulse 88  Temp(Src) 97.6 F (36.4 C) (Oral)  Resp 22  SpO2 99% Physical Exam  Constitutional: He is oriented to person, place, and time. He appears well-developed and well-nourished. No distress.  HENT:  Head: Normocephalic and atraumatic.  Right Ear: External  ear normal.  Left Ear: External ear normal.  Nose: Nose normal.  Mouth/Throat: Oropharynx is clear and moist. No oropharyngeal exudate.  Eyes: Conjunctivae are normal. Pupils are equal, round, and reactive to light. Right eye exhibits no discharge. Left eye exhibits no discharge. No scleral icterus.  Neck: Normal range of motion. Neck supple. No tracheal deviation present.   Cardiovascular: Normal rate, regular rhythm and normal heart sounds.  Exam reveals no gallop and no friction rub.   No murmur heard. Pulmonary/Chest: Effort normal and breath sounds normal. No respiratory distress. He has no wheezes. He has no rales.  Abdominal: Soft. Bowel sounds are normal. He exhibits no distension and no mass. There is tenderness (R side of the abdomen, worse in the RLQ). There is no rebound and no guarding.  Musculoskeletal: Normal range of motion. He exhibits no edema or tenderness.  Neurological: He is alert and oriented to person, place, and time. He exhibits normal muscle tone.  Skin: Skin is warm and dry. No rash noted. He is not diaphoretic.  Psychiatric: He has a normal mood and affect. His behavior is normal. Judgment and thought content normal.    ED Course  Procedures (including critical care time) Labs Review Labs Reviewed  COMPREHENSIVE METABOLIC PANEL - Abnormal; Notable for the following:    Potassium 3.4 (*)    Total Protein 6.4 (*)    All other components within normal limits  CBC - Abnormal; Notable for the following:    WBC 11.2 (*)    All other components within normal limits  URINALYSIS, ROUTINE W REFLEX MICROSCOPIC (NOT AT [facility]) - Abnormal; Notable for the following:    Specific Gravity, Urine 1.038 (*)    Ketones, ur 15 (*)    All other components within normal limits  LIPASE, BLOOD    Imaging Review Ct Abdomen Pelvis W Contrast  09/13/2015  CLINICAL DATA:  Right-sided abdominal pain since last night. History of an inguinal hernia repair. EXAM: CT ABDOMEN AND PELVIS WITH CONTRAST TECHNIQUE: Multidetector CT imaging of the abdomen and pelvis was performed using the standard protocol following bolus administration of intravenous contrast. CONTRAST:  126mL OMNIPAQUE IOHEXOL 300 MG/ML  SOLN COMPARISON:  Ultrasound, 11/22/2013 FINDINGS: Lung bases:  Clear.  Heart normal size. Hepatobiliary: Large, hypo attenuating mildly heterogeneous mass arises  from the posterior segment of the right liver lobe along its inferior margin. It measures 8.5 x 8.0 x 9.5 cm. There is stranding in the fat along the inferior margin of the liver adjacent the mass extending to anterior right para renal and perirenal spaces. No other liver masses or lesions. There is a discrete dependent gallstone. No gallbladder wall thickening or inflammatory changes. No bile duct dilation. Spleen, pancreas, adrenal glands:  Normal. Kidneys, ureters, bladder: 3 mm nonobstructing stone in the midpole the right kidney. No renal masses. No other intrarenal stones. No hydronephrosis. There is a duplicated collecting system on the left. Ureters are normal course and caliber. Normal bladder. Lymph nodes:  No adenopathy. Ascites:  None. Gastrointestinal: Stomach, small bowel and colon are unremarkable. Normal appendix visualized. Musculoskeletal: Mild degenerative changes of the visualized spine and right hip. No osteoblastic or osteolytic lesions. IMPRESSION: 1. 9.5 cm mass arises from the posterior segment of the right liver lobe. This does not have typical imaging characteristics of a hemangioma. There are also inflammatory type changes that extend from the inferior margin of the liver along the right anterior perirenal and para renal spaces. Mass may reflect neoplastic disease. An  inflammatory or infectious mass is possible. 2. No other findings to explain right sided abdominal pain. Normal appendix visualized. 3. Nonobstructing right intrarenal stone.  Gallstone. Electronically Signed   By: Lajean Manes M.D.   On: 09/13/2015 19:33   I have personally reviewed and evaluated these images and lab results as part of my medical decision-making.   EKG Interpretation None      MDM   Final diagnoses:  Liver mass    CT abdomen and pelvis performed for evaluation of abdominal tenderness that revealed a 9.5 cm mass arising from the posterior segment of the right liver lobe. 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 Labs unremarkable. The oncologist on-call, Dr. Learta Codding, was contacted regarding the patient's case. He will be arranging follow-up for the patient in his clinic early next week for further diagnostic testing to include biopsy. Patient was fully updated regarding these results and will follow up with oncology next week. He was given oxycodone for pain control and discharged with a prescription for interim pain management. As he is hemodynamically stable and tolerating oral intake, he is stable for discharge home. He was given strict precautions to return to the emergency department for sudden worsening of his pain.     Jenifer Algernon Huxley, MD 09/14/15 [id number]  Pattricia Boss, MD 09/14/15 [phone]

## 2015-09-14 ENCOUNTER — Telehealth: Payer: Self-pay | Admitting: *Deleted

## 2015-09-14 NOTE — Telephone Encounter (Signed)
Received referral from ER for liver mass in patient with history of ocular melanoma. Patient requesting Dr. Irene Limbo as his oncologist. Scheduled to see Dr. Irene Limbo on 09/17/15 at 2:45/3:00pm. Was diagnosed and treated in [place]. Has his follow up exams/scans at [facility]. HIM notified.

## 2015-09-17 ENCOUNTER — Telehealth: Payer: Self-pay | Admitting: Hematology

## 2015-09-17 ENCOUNTER — Encounter: Payer: Self-pay | Admitting: Hematology

## 2015-09-17 ENCOUNTER — Ambulatory Visit (HOSPITAL_BASED_OUTPATIENT_CLINIC_OR_DEPARTMENT_OTHER): Payer: BLUE CROSS/BLUE SHIELD | Admitting: Hematology

## 2015-09-17 VITALS — BP 128/79 | HR 82 | Temp 98.6°F | Resp 18 | Ht 71.0 in | Wt 167.6 lb

## 2015-09-17 DIAGNOSIS — K219 Gastro-esophageal reflux disease without esophagitis: Secondary | ICD-10-CM | POA: Diagnosis not present

## 2015-09-17 DIAGNOSIS — R160 Hepatomegaly, not elsewhere classified: Secondary | ICD-10-CM | POA: Diagnosis not present

## 2015-09-17 DIAGNOSIS — K5903 Drug induced constipation: Secondary | ICD-10-CM

## 2015-09-17 DIAGNOSIS — C6931 Malignant neoplasm of right choroid: Secondary | ICD-10-CM

## 2015-09-17 DIAGNOSIS — Z85820 Personal history of malignant melanoma of skin: Secondary | ICD-10-CM | POA: Diagnosis not present

## 2015-09-17 DIAGNOSIS — C6930 Malignant neoplasm of unspecified choroid: Secondary | ICD-10-CM | POA: Insufficient documentation

## 2015-09-17 MED ORDER — SENNOSIDES-DOCUSATE SODIUM 8.6-50 MG PO TABS: 2.0000 | ORAL_TABLET | Freq: Every day | ORAL | Status: DC

## 2015-09-17 MED ORDER — LORAZEPAM 0.5 MG PO TABS
ORAL_TABLET | ORAL | Status: DC
Start: 2015-09-17 — End: 2015-09-24

## 2015-09-17 NOTE — Telephone Encounter (Signed)
Gave and printed appt shced and avs for pt for April °

## 2015-09-18 ENCOUNTER — Other Ambulatory Visit: Payer: Self-pay

## 2015-09-18 ENCOUNTER — Ambulatory Visit (HOSPITAL_BASED_OUTPATIENT_CLINIC_OR_DEPARTMENT_OTHER): Payer: BLUE CROSS/BLUE SHIELD | Admitting: Nurse Practitioner

## 2015-09-18 ENCOUNTER — Other Ambulatory Visit (HOSPITAL_BASED_OUTPATIENT_CLINIC_OR_DEPARTMENT_OTHER): Payer: BLUE CROSS/BLUE SHIELD

## 2015-09-18 ENCOUNTER — Ambulatory Visit (HOSPITAL_BASED_OUTPATIENT_CLINIC_OR_DEPARTMENT_OTHER): Payer: BLUE CROSS/BLUE SHIELD

## 2015-09-18 ENCOUNTER — Encounter: Payer: Self-pay | Admitting: Nurse Practitioner

## 2015-09-18 ENCOUNTER — Other Ambulatory Visit: Payer: Self-pay | Admitting: Hematology

## 2015-09-18 ENCOUNTER — Encounter: Payer: Self-pay | Admitting: Hematology

## 2015-09-18 VITALS — BP 128/73 | HR 67 | Temp 97.9°F | Resp 18

## 2015-09-18 DIAGNOSIS — R160 Hepatomegaly, not elsewhere classified: Secondary | ICD-10-CM

## 2015-09-18 DIAGNOSIS — R55 Syncope and collapse: Secondary | ICD-10-CM

## 2015-09-18 DIAGNOSIS — C6931 Malignant neoplasm of right choroid: Secondary | ICD-10-CM

## 2015-09-18 DIAGNOSIS — C6930 Malignant neoplasm of unspecified choroid: Secondary | ICD-10-CM

## 2015-09-18 LAB — PROTIME-INR
INR: 1 — AB (ref 2.00–3.50)
PROTIME: 12 s (ref 10.6–13.4)

## 2015-09-18 LAB — CBC & DIFF AND RETIC
BASO%: 0.2 % (ref 0.0–2.0)
BASOS ABS: 0 10*3/uL (ref 0.0–0.1)
EOS ABS: 0.1 10*3/uL (ref 0.0–0.5)
EOS%: 1.8 % (ref 0.0–7.0)
HEMATOCRIT: 44.1 % (ref 38.4–49.9)
HEMOGLOBIN: 15.8 g/dL (ref 13.0–17.1)
IMMATURE RETIC FRACT: 1.9 % — AB (ref 3.00–10.60)
LYMPH#: 2 10*3/uL (ref 0.9–3.3)
LYMPH%: 31.6 % (ref 14.0–49.0)
MCH: 29.5 pg (ref 27.2–33.4)
MCHC: 35.8 g/dL (ref 32.0–36.0)
MCV: 82.4 fL (ref 79.3–98.0)
MONO#: 0.5 10*3/uL (ref 0.1–0.9)
MONO%: 8.3 % (ref 0.0–14.0)
NEUT#: 3.7 10*3/uL (ref 1.5–6.5)
NEUT%: 58.1 % (ref 39.0–75.0)
Platelets: 231 10*3/uL (ref 140–400)
RBC: 5.35 10*6/uL (ref 4.20–5.82)
RDW: 12.2 % (ref 11.0–14.6)
Retic %: 1.63 % (ref 0.80–1.80)
Retic Ct Abs: 87.21 10*3/uL (ref 34.80–93.90)
WBC: 6.3 10*3/uL (ref 4.0–10.3)

## 2015-09-18 LAB — COMPREHENSIVE METABOLIC PANEL
ALBUMIN: 4 g/dL (ref 3.5–5.0)
ALK PHOS: 59 U/L (ref 40–150)
ALT: 19 U/L (ref 0–55)
AST: 17 U/L (ref 5–34)
Anion Gap: 8 mEq/L (ref 3–11)
BILIRUBIN TOTAL: 0.7 mg/dL (ref 0.20–1.20)
BUN: 12.5 mg/dL (ref 7.0–26.0)
CALCIUM: 9.4 mg/dL (ref 8.4–10.4)
CO2: 28 mEq/L (ref 22–29)
Chloride: 104 mEq/L (ref 98–109)
Creatinine: 0.8 mg/dL (ref 0.7–1.3)
Glucose: 80 mg/dl (ref 70–140)
POTASSIUM: 3.9 meq/L (ref 3.5–5.1)
Sodium: 140 mEq/L (ref 136–145)
TOTAL PROTEIN: 7.4 g/dL (ref 6.4–8.3)

## 2015-09-18 LAB — LACTATE DEHYDROGENASE: LDH: 163 U/L (ref 125–245)

## 2015-09-18 MED ORDER — SODIUM CHLORIDE 0.9 % IV SOLN
Freq: Once | INTRAVENOUS | Status: AC
  Administered 2015-09-18: 16:00:00 via INTRAVENOUS

## 2015-09-18 NOTE — Progress Notes (Signed)
Marland Kitchen    HEMATOLOGY/ONCOLOGY CONSULTATION NOTE  Date of Service: 09/17/2015  Patient Care Team: Hulan Fess, MD as PCP - General (Family Medicine)  CHIEF COMPLAINTS/PURPOSE OF CONSULTATION:  History of right eye choroidal melanoma New liver mass  HISTORY OF PRESENTING ILLNESS:   Gatlin Kittell is a wonderful 45 y.o. male who has been referred to [place] by Jennette Bill, MD for evaluation and management of newly noted liver mass.  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 The initial lesion thickening was 12 x 10 x 1.6 mm status post proton beam radiation therapy in [place] in 2009 with plaque in place for 1 week. 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 Last treatment was in 2012. Patient is also noted to have optic atrophy. He has fairly limited vision in his right eye restricted to light perception and some movement perception.  Patient notes that he had an MRI of the brain at the time of his initial diagnosis which was negative. He has been following up with Dr. Gerarda Fraction at  [facility] for his ophthalmology cares. He was last seen in June 2016 and has an appointment for followup on 09/18/2015 for re-evaluation.  Patient was initially getting every 6 months ultrasounds of his liver and then every year.  His last ultrasound of the liver was done in in June 2015 which showed no focal hepatic masses and no intrahepatic biliary ductal dilatation.  Patient was noted to have some small mobile gallstones in the gallbladder with no gallbladder thickening.  Patient notes that he previously has had an EGD and colonoscopy and prostate examination within the last 5 years all of which have been within normal limits.  Patient was in his usual state of health until a few months ago  when he had some mild increase in fatigue which he attributed it to being very busy at work and not getting adequate sleep.  He has noted some right upper abdominal and epigastric discomfort on and off for the last couple of weeks.he presented to the emergency room on 09/13/2015 with right-sided abdominal pain.  He had a CT scan of the abdomen and pelvis which showed a 9.5 cm mass arising from the posterior segment of the right liver lobe.  There were also some inflammatory changes that extend from the inferior margin of the liver along the right anterior perirenal and pararenal spaces.  Patient was referred to our clinic for consultation from the emergency room due to concern for metastatic melanoma/other neoplastic lesion.  Patient notes minimal right upper abdominal pain which is controlled with oxycodone that he uses only at nighttime.  Most significant weight loss.  No change in bowel habits.  No blood in the stools.  No blood in the urine.  No headaches.  No focal neurological deficits.  Patient is quite educated about his medical condition.    MEDICAL HISTORY:  Past Medical History  Diagnosis Date  . GERD (gastroesophageal reflux disease)   . Cancer ([place]) 2009    eye - right (below retina)  . Mass     chest wall    SURGICAL HISTORY: Past Surgical History  Procedure Laterality Date  . Eye surgery  2009    due to cancer  . Inguinal hernia repair Right 1994  . Mass excision  08/29/2011  Procedure: MINOR EXCISION OF MASS;  Surgeon: Adin Hector, MD;  Location: [facility];  Service: General;  Laterality: Left;  local excision of left chest wall mass   . Mass excision N/A 04/11/2014    Procedure: MINOR EXCISION OF EPIDERMOID CYSTS FROM BACK X2;  Surgeon: Fanny Skates, MD;  Location: [facility];  Service: General;  Laterality: N/A;    SOCIAL HISTORY: Social History   Social History  . Marital Status: Married    Spouse Name: N/A  . Number of  Children: 1  . Years of Education: N/A   Occupational History  . Director of [employer]    Social History Main Topics  . Smoking status: Never Smoker   . Smokeless tobacco: Never Used  . Alcohol Use: Yes     Comment: occasional 2 -3 per week  . Drug Use: No  . Sexual Activity: Not on file   Other Topics Concern  . Not on file   Social History Narrative  Works as a Sales promotion account executive for [employer]. Has a 34-year-old son.  He is married. Originally from [place].   FAMILY HISTORY: Family History  Problem Relation Age of Onset  . Prostate cancer Paternal Grandfather   . Diabetes Mother   . Hypertension Father     ALLERGIES:  has No Known Allergies.  MEDICATIONS:  Current Outpatient Prescriptions  Medication Sig Dispense Refill  . LORazepam (ATIVAN) 0.5 MG tablet 2 tab 30-40 mins prior to MRI or PET/CT 20 tablet 0  . oxyCODONE (ROXICODONE) 5 MG immediate release tablet Take 1 tablet (5 mg total) by mouth every 6 (six) hours as needed for severe pain. 15 tablet 0  . senna-docusate (SENNA S) 8.6-50 MG tablet Take 2 tablets by mouth at bedtime. 60 tablet 1   No current facility-administered medications for this visit.    REVIEW OF SYSTEMS:    10 Point review of Systems was done is negative except as noted above.  PHYSICAL EXAMINATION: ECOG PERFORMANCE STATUS: 0 - Asymptomatic  . Filed Vitals:   09/17/15 1449  BP: 128/79  Pulse: 82  Temp: 98.6 F (37 C)  Resp: 18   Filed Weights   09/17/15 1449  Weight: 167 lb 9.6 oz (76.023 kg)   .Body mass index is 23.39 kg/(m^2).  GENERAL:alert, in no acute distress and comfortable SKIN: skin color, texture, turgor are normal, no rashes or significant lesions EYES: normal, conjunctiva are pink and non-injected, sclera clear OROPHARYNX:no exudate, no erythema and lips, buccal mucosa, and tongue normal  NECK: supple, no JVD, thyroid normal size, non-tender, without nodularity LYMPH:  no palpable  lymphadenopathy in the cervical, axillary or inguinal LUNGS: clear to auscultation with normal respiratory effort HEART: regular rate & rhythm,  no murmurs and no lower extremity edema ABDOMEN: abdomen soft, non-tender, normoactive bowel sounds , minimal tenderness to palpation over her right upper quadrant.  No guarding or rigidity or rebound. Musculoskeletal: no cyanosis of digits and no clubbing  PSYCH: alert & oriented x 3 with fluent speech NEURO: no focal motor/sensory deficits  LABORATORY DATA:  I have reviewed the data as listed  . CBC Latest Ref Rng 09/13/2015  WBC 4.0 - 10.5 K/uL 11.2(H)  Hemoglobin 13.0 - 17.0 g/dL 15.5  Hematocrit 39.0 - 52.0 % 43.9  Platelets 150 - 400 K/uL 207    . CMP Latest Ref Rng 09/13/2015  Glucose 65 - 99 mg/dL 99  BUN 6 - 20 mg/dL 14  Creatinine 0.61 -  1.24 mg/dL 0.83  Sodium 135 - 145 mmol/L 137  Potassium 3.5 - 5.1 mmol/L 3.4(L)  Chloride 101 - 111 mmol/L 104  CO2 22 - 32 mmol/L 26  Calcium 8.9 - 10.3 mg/dL 9.1  Total Protein 6.5 - 8.1 g/dL 6.4(L)  Total Bilirubin 0.3 - 1.2 mg/dL 0.8  Alkaline Phos 38 - 126 U/L 52  AST 15 - 41 U/L 23  ALT 17 - 63 U/L 29     RADIOGRAPHIC STUDIES: I have personally reviewed the radiological images as listed and agreed with the findings in the report. Ct Abdomen Pelvis W Contrast  09/13/2015  CLINICAL DATA:  Right-sided abdominal pain since last night. History of an inguinal hernia repair. EXAM: CT ABDOMEN AND PELVIS WITH CONTRAST TECHNIQUE: Multidetector CT imaging of the abdomen and pelvis was performed using the standard protocol following bolus administration of intravenous contrast. CONTRAST:  160m OMNIPAQUE IOHEXOL 300 MG/ML  SOLN COMPARISON:  Ultrasound, 11/22/2013 FINDINGS: Lung bases:  Clear.  Heart normal size. Hepatobiliary: Large, hypo attenuating mildly heterogeneous mass arises from the posterior segment of the right liver lobe along its inferior margin. It measures 8.5 x 8.0 x 9.5 cm. There is  stranding in the fat along the inferior margin of the liver adjacent the mass extending to anterior right para renal and perirenal spaces. No other liver masses or lesions. There is a discrete dependent gallstone. No gallbladder wall thickening or inflammatory changes. No bile duct dilation. Spleen, pancreas, adrenal glands:  Normal. Kidneys, ureters, bladder: 3 mm nonobstructing stone in the midpole the right kidney. No renal masses. No other intrarenal stones. No hydronephrosis. There is a duplicated collecting system on the left. Ureters are normal course and caliber. Normal bladder. Lymph nodes:  No adenopathy. Ascites:  None. Gastrointestinal: Stomach, small bowel and colon are unremarkable. Normal appendix visualized. Musculoskeletal: Mild degenerative changes of the visualized spine and right hip. No osteoblastic or osteolytic lesions. IMPRESSION: 1. 9.5 cm mass arises from the posterior segment of the right liver lobe. This does not have typical imaging characteristics of a hemangioma. There are also inflammatory type changes that extend from the inferior margin of the liver along the right anterior perirenal and para renal spaces. Mass may reflect neoplastic disease. An inflammatory or infectious mass is possible. 2. No other findings to explain right sided abdominal pain. Normal appendix visualized. 3. Nonobstructing right intrarenal stone.  Gallstone. Electronically Signed   By: DLajean ManesM.D.   On: 09/13/2015 19:33    ASSESSMENT & PLAN:   45year old [place] man with history of choroidal melanoma diagnosed in 2009 presented with new large hepatic mass.  #1 Right sided 9.5 cm newly noted hepatic mass concerning for neoplastic lesion.  Patient has no history of hepatitis C or liver cirrhosis.  Given his history of choroidal melanoma primary concern would be to rule out metastatic uveal melanoma. Patient has had some travel outside the [place] in connection with his work previously.  Cannot  rule out an infectious/inflammatory etiology though this is less likely.  No fevers or chills.  No significant weight loss.  #2 history of right eye choroidal melanoma diagnosed in 2009 had plaque placement and [place]  He developed a radiation retinopathy which was treated with laser ablation and Avastin ( completed in 2012).  Also noted to have optic atrophy and minimal residual vision in his rt eye.  #3 GERD -controlled with lifestyle modifications.  #4 Opiate related constipation.  PLAN -  We'll get CBC, CMP, LDH, and the tumor marker, CEA level. -Hep C and HIV testing. -PT and PTT that would be needed for his biopsy. -Stat CT-guided biopsy of his right posterior liver mass. -PET/CT scan for staging. -MRI of the brain with and without contrast. -Given a prescription for lorazepam as a premedication for his MRI and PET/CT as needed due to claustrophobia. -Senna S prescription given for opiate related constipation. -we discussed the possible differential diagnosis. -Patient notes that he there was a consideration for surgery and he would like to follow at [facility] for surgical considerations which is quite reasonable. -If this is noted to be a melanoma would need to get BRaf mutation, KIT mutation, PDL1 testing on sample. -patient has followup with his ophthalmologist Dr. Cordelia Pen tomorrow to reassess the status of his local right eye choroidal melanoma.  RTC with Dr Irene Limbo in 7-10 days after completion of all the above ordered workup.  All of the patients questions were answered to his apparent satisfaction. The patient knows to call the clinic with any problems, questions or concerns.  I spent 60 minutes counseling the patient face to face. The total time spent in the appointment was 70 minutes and more than 50% was on counseling and direct patient cares.    Sullivan Lone MD [place] AAHIVMS [facility] Hematology/Oncology Physician [facility]  (Office):       [phone] (Work cell):  [phone] (Fax):           [phone]  09/17/2015 3:03 PM

## 2015-09-18 NOTE — Assessment & Plan Note (Signed)
Patient has previously diagnosed choroidal malignant melanoma.  He is now experiencing significant liver pain; and is scheduled to undergo imaging for further evaluation and diagnosis within the next few days.  Patient is scheduled for a brain MRI on 09/20/2015.  He is scheduled for a PET scan on 09/21/2015.  He is scheduled for a biopsy on 09/24/2015.  He is scheduled for follow-up visit here at the [facility] on 10/01/2015.

## 2015-09-18 NOTE — Progress Notes (Signed)
SYMPTOM MANAGEMENT CLINIC    Chief Complaint: Near syncope   HPI:  Darius Fox 45 y.o. male diagnosed with coronal malignant melanoma; with further imaging planned to rule out liver metastasis.  Patient presented to the [facility] today to have his labs drawn.  He had just had his labs drawn; and patient experienced a near syncopal event.  Patient became very pale/gray; and was diaphoretic.  Systolic blood pressure was initially 58; and then was 64 on recheck.  Heart rate stable at 59-60.  O2 sat was 100% on room air.  Patient then awoke; and was able to drink water and carry on conversation with staff.  Patient stated that he had ate breakfast; but had had very little to eat or drink since that time.  Patient also stated that he has been experiencing increased liver pain recently; and he has been known to pass out in the past when he experiences pain.  Of note-patient presented to the emergency department this past week on Thursday, 09/13/2015 with complaint of liver pain.  Prior to having any procedures in the emergency department-patient had a syncopal event.  He then proceeded to have 2 additional near syncope events as well.  Rapid response team was called; and IV initiated.  Patient received IV fluids wide open; and O2 was placed via nasal cannula at 2 L..  Systolic blood pressure improved to 112.  Patient appeared much more alert; and stated that he was feeling better.  Patient was then transported to the [facility] infusion area; to complete his IV fluid rehydration.  Patient's vital signs returned to baseline; and patient was alert, communicating with staff; and following all commands.  He denied any other new symptoms whatsoever.  Dr. Irene Limbo in to examine pt as well.  Pt stated that he felt stable; and preferred to drive himself home.  Patient denied any neurological problems whatsoever.  Patient was sized to go directly to the emergency department if he developed any  worsening symptoms whatsoever.    No history exists.    Review of Systems  Gastrointestinal: Positive for abdominal pain.  All other systems reviewed and are negative.   Past Medical History  Diagnosis Date  . GERD (gastroesophageal reflux disease)   . Cancer ([place]) 2009    eye - right (below retina)  . Mass     chest wall    Past Surgical History  Procedure Laterality Date  . Eye surgery  2009    due to cancer  . Inguinal hernia repair Right 1994  . Mass excision  08/29/2011    Procedure: MINOR EXCISION OF MASS;  Surgeon: Adin Hector, MD;  Location: [facility];  Service: General;  Laterality: Left;  local excision of left chest wall mass   . Mass excision N/A 04/11/2014    Procedure: MINOR EXCISION OF EPIDERMOID CYSTS FROM BACK X2;  Surgeon: Fanny Skates, MD;  Location: [facility];  Service: General;  Laterality: N/A;    has Epidermoid cyst of skin of chest; Mass of right lobe of liver; Choroidal malignant melanoma ([place]); and Vasovagal near syncope on his problem list.    has No Known Allergies.    Medication List       This list is accurate as of: 09/18/15  6:18 PM.  Always use your most recent med list.               LORazepam 0.5 MG tablet  Commonly known as:  ATIVAN  2 tab 30-40 mins prior to MRI or PET/CT     oxyCODONE 5 MG immediate release tablet  Commonly known as:  ROXICODONE  Take 1 tablet (5 mg total) by mouth every 6 (six) hours as needed for severe pain.     senna-docusate 8.6-50 MG tablet  Commonly known as:  SENNA S  Take 2 tablets by mouth at bedtime.         PHYSICAL EXAMINATION  Oncology Vitals 09/18/2015 09/18/2015  Height - -  Weight - -  Weight (lbs) - -  BMI (kg/m2) - -  Temp 97.9 -  Pulse 67 67  Resp 18 -  SpO2 100 99  BSA (m2) - -   BP Readings from Last 2 Encounters:  09/18/15 128/73  09/17/15 128/79    Physical Exam  Constitutional: He is oriented to person, place, and time and  well-developed, well-nourished, and in no distress.  HENT:  Head: Normocephalic and atraumatic.  Eyes: Conjunctivae and EOM are normal. Pupils are equal, round, and reactive to light. Right eye exhibits no discharge. Left eye exhibits no discharge. No scleral icterus.  Neck: Normal range of motion. Neck supple. No JVD present. No tracheal deviation present. No thyromegaly present.  Cardiovascular: Normal rate, regular rhythm, normal heart sounds and intact distal pulses.   Pulmonary/Chest: Effort normal and breath sounds normal. No respiratory distress. He has no wheezes. He has no rales. He exhibits no tenderness.  Abdominal: Soft. Bowel sounds are normal. He exhibits no distension and no mass. There is no tenderness. There is no rebound and no guarding.  Musculoskeletal: Normal range of motion. He exhibits no edema or tenderness.  Lymphadenopathy:    He has no cervical adenopathy.  Neurological: He is alert and oriented to person, place, and time. Gait normal.  Skin: Skin is warm and dry. No rash noted. No erythema. No pallor.  Patient was initially pale; but recovered.  Psychiatric: Affect normal.  Nursing note and vitals reviewed.   LABORATORY DATA:. Appointment on 09/18/2015  Component Date Value Ref Range Status  . WBC 09/18/2015 6.3  4.0 - 10.3 10e3/uL Final  . NEUT# 09/18/2015 3.7  1.5 - 6.5 10e3/uL Final  . HGB 09/18/2015 15.8  13.0 - 17.1 g/dL Final  . HCT 09/18/2015 44.1  38.4 - 49.9 % Final  . Platelets 09/18/2015 231  140 - 400 10e3/uL Final  . MCV 09/18/2015 82.4  79.3 - 98.0 fL Final  . MCH 09/18/2015 29.5  27.2 - 33.4 pg Final  . MCHC 09/18/2015 35.8  32.0 - 36.0 g/dL Final  . RBC 09/18/2015 5.35  4.20 - 5.82 10e6/uL Final  . RDW 09/18/2015 12.2  11.0 - 14.6 % Final  . lymph# 09/18/2015 2.0  0.9 - 3.3 10e3/uL Final  . MONO# 09/18/2015 0.5  0.1 - 0.9 10e3/uL Final  . Eosinophils Absolute 09/18/2015 0.1  0.0 - 0.5 10e3/uL Final  . Basophils Absolute 09/18/2015 0.0  0.0  - 0.1 10e3/uL Final  . NEUT% 09/18/2015 58.1  39.0 - 75.0 % Final  . LYMPH% 09/18/2015 31.6  14.0 - 49.0 % Final  . MONO% 09/18/2015 8.3  0.0 - 14.0 % Final  . EOS% 09/18/2015 1.8  0.0 - 7.0 % Final  . BASO% 09/18/2015 0.2  0.0 - 2.0 % Final  . Retic % 09/18/2015 1.63  0.80 - 1.80 % Final  . Retic Ct Abs 09/18/2015 87.21  34.80 - 93.90 10e3/uL Final  . Immature Retic Fract 09/18/2015 1.90* 3.00 - 10.60 %  Final  . Sodium 09/18/2015 140  136 - 145 mEq/L Final  . Potassium 09/18/2015 3.9  3.5 - 5.1 mEq/L Final  . Chloride 09/18/2015 104  98 - 109 mEq/L Final  . CO2 09/18/2015 28  22 - 29 mEq/L Final  . Glucose 09/18/2015 80  70 - 140 mg/dl Final   Glucose reference range is for nonfasting patients. Fasting glucose reference range is 70- 100.  Marland Kitchen BUN 09/18/2015 12.5  7.0 - 26.0 mg/dL Final  . Creatinine 09/18/2015 0.8  0.7 - 1.3 mg/dL Final  . Total Bilirubin 09/18/2015 0.70  0.20 - 1.20 mg/dL Final  . Alkaline Phosphatase 09/18/2015 59  40 - 150 U/L Final  . AST 09/18/2015 17  5 - 34 U/L Final  . ALT 09/18/2015 19  0 - 55 U/L Final  . Total Protein 09/18/2015 7.4  6.4 - 8.3 g/dL Final  . Albumin 09/18/2015 4.0  3.5 - 5.0 g/dL Final  . Calcium 09/18/2015 9.4  8.4 - 10.4 mg/dL Final  . Anion Gap 09/18/2015 8  3 - 11 mEq/L Final  . EGFR 09/18/2015 >90  >90 ml/min/1.73 m2 Final   eGFR is calculated using the CKD-EPI Creatinine Equation (2009)  . LDH 09/18/2015 163  125 - 245 U/L Final  . Protime 09/18/2015 12.0  10.6 - 13.4 Seconds Final  . INR 09/18/2015 1.00* 2.00 - 3.50 Final   Comment: INR is useful only to assess adequacy of anticoagulation with coumadin when comparing results from different labs. It should not be used to estimate bleeding risk or presence/abscense of coagulopathy in patients not on coumadin. Expected INR ranges for  nontherapeutic patients is 0.88 - 1.12.   Marland Kitchen Lovenox 09/18/2015 No   Final   EKG: DAYMOND, CORDTS PR:[id number] 18-Sep-2015 16:42:38 [facility] ROUTINE RECORD Normal sinus rhythm Normal ECG Confirmed by CROITORU, MD, MIHAI [phone]) on 09/18/2015 6:02:34 PM 7m/s 19mmV '100Hz'  8.0 SP2 12SL 237 CID: [id number] Referred by: Confirmed By: MISanda KleinMD Vent. rate 63 BPM PR interval 152 ms QRS duration 98 ms QT/QTc 420/429 ms P-R-T axes 59 59 56 [id number]) Male Caucasian Room: Loc:511 Technician  RADIOGRAPHIC STUDIES: No results found.  ASSESSMENT/PLAN:    Choroidal malignant melanoma (HCLenaPatient has previously diagnosed choroidal malignant melanoma.  He is now experiencing significant liver pain; and is scheduled to undergo imaging for further evaluation and diagnosis within the next few days.  Patient is scheduled for a brain MRI on 09/20/2015.  He is scheduled for a PET scan on 09/21/2015.  He is scheduled for a biopsy on 09/24/2015.  He is scheduled for [facility].  Vasovagal near syncope [facility].  He had just had his labs drawn; and patient experienced a near syncopal event.  Patient became very pale/gray; and was diaphoretic.  Systolic blood pressure was initially 58; and then was 64 on recheck.  Heart rate stable at 59-60.  O2 sat was 100% on room air.  Patient then awoke; and was able to drink water and carry on conversation with staff.  Patient stated that he had ate breakfast; but had had very little to eat or drink since that time.  EKG obtained today was essentially normal.  Patient also stated that he has been experiencing increased liver pain recently; and he has been known to pass out in the past when he experiences pain.  Of note-patient presented  to the emergency department this past week on Thursday, 09/13/2015 with complaint of liver pain.  Prior to having any procedures in the emergency department-patient had a syncopal event.  He then proceeded to have 2 additional near syncope  events as well.  Rapid response team was called; and IV initiated.  Patient received IV fluids wide open; and O2 was placed via nasal cannula at 2 L..  Systolic blood pressure improved to 112.  Patient appeared much more alert; and stated that he was feeling better.  Patient was then transported to the [facility] infusion area; to complete his IV fluid rehydration.  Patient's vital signs returned to baseline; and patient was alert, communicating with staff; and following all commands.  He denied any other new symptoms whatsoever.  Dr. Irene Limbo in to examine pt as well.  Pt stated that he felt stable; and preferred to drive himself home.  Patient denied any neurological problems whatsoever.  Patient was sized to go directly to the emergency department if he developed any worsening symptoms whatsoever.   Patient stated understanding of all instructions; and was in agreement with this plan of care. The patient knows to call the clinic with any problems, questions or concerns.   This was a shared visit with Dr. Irene Limbo today.   Total time spent with patient was 40 minutes;  with greater than 75 percent of that time spent in face to face counseling regarding patient's symptoms,  and coordination of care and follow up.  Disclaimer:This dictation was prepared with Dragon/digital dictation along with [employer]. Any transcriptional errors that result from this process are unintentional.  Drue Second, NP 09/18/2015    Addendum  Mr Hohmann was evaluated in the infusion room for his near syncopal event. This happened prior to having lab draws. He notes he is had them in the past when he was somewhat dehydrated. Hadn't eaten much today. No chest pain no shortness of breath no focal neurological deficits. No significantly increased abdominal pain over baseline. Received IV fluids and some snacks with complete resolution of his symptoms.  Labs show stable hemoglobin. EKG normal sinus rhythm  with no consenting ST-T wave changes.  She was made to ambulate to ensure complete resolution of symptoms prior to discharge. He was recommended that perhaps he should take a taxi perhaps somebody else tried. He notes that he feels comfortable driving and will not take any interstate. He was recommended to seek immediate attention for any recurrent or other new symptoms.  Clinical findings plan of care were discussed in details with Retta Mac NP  Sullivan Lone MD [place]

## 2015-09-18 NOTE — Patient Instructions (Signed)

## 2015-09-18 NOTE — Assessment & Plan Note (Addendum)
Patient presented to the [facility] today to have his labs drawn.  He had just had his labs drawn; and patient experienced a near syncopal event.  Patient became very pale/gray; and was diaphoretic.  Systolic blood pressure was initially 58; and then was 64 on recheck.  Heart rate stable at 59-60.  O2 sat was 100% on room air.  Patient then awoke; and was able to drink water and carry on conversation with staff.  Patient stated that he had ate breakfast; but had had very little to eat or drink since that time.  EKG obtained today was essentially normal.  Patient also stated that he has been experiencing increased liver pain recently; and he has been known to pass out in the past when he experiences pain.  Of note-patient presented to the emergency department this past week on Thursday, 09/13/2015 with complaint of liver pain.  Prior to having any procedures in the emergency department-patient had a syncopal event.  He then proceeded to have 2 additional near syncope events as well.  Rapid response team was called; and IV initiated.  Patient received IV fluids wide open; and O2 was placed via nasal cannula at 2 L..  Systolic blood pressure improved to 112.  Patient appeared much more alert; and stated that he was feeling better.  Patient was then transported to the [facility] infusion area; to complete his IV fluid rehydration.  Patient's vital signs returned to baseline; and patient was alert, communicating with staff; and following all commands.  He denied any other new symptoms whatsoever.  Dr. Irene Limbo in to examine pt as well.  Pt stated that he felt stable; and preferred to drive himself home.  Patient denied any neurological problems whatsoever.  Patient was sized to go directly to the emergency department if he developed any worsening symptoms whatsoever.

## 2015-09-18 NOTE — Progress Notes (Signed)
Pt arrived to infusion via stretcher after rapid response called for multiple episodes of pt "passing out."  Report received from Selena Lesser, NP upon arrival to infusion area.  NS infusing wide open through PIV.  Patient alert and oriented.  Written vitals from episode were given to me and include initial CBG 88, BP 58/50 HR 61; BP recheck 67/43 HR 60 O2 100%; BP prior to arrival to infusion 112/63.  Vitals obtained once in infusion and seated and found to be 131/74 HR 67 O2 99%.  Pt given snack and drink and has no complaints at this time. Verbal order given to infuse NS at 999 mL/hr, recheck vitals, and then call Cyndee, NP with further instructions.  Will continue to monitor.    1710: IVF complete.  Vitals obtained and remain stable as charted.  Pt without any question or concerns at this time.  Pt seen by both Selena Lesser, NP and Irene Limbo, MD in treatment room.  Okay given for pt to be discharged.  Pt verbalized understanding to contact us should he have any questions or new symptoms.  AVS declined as pt states he will just look everything up on "My Chart."

## 2015-09-19 ENCOUNTER — Telehealth: Payer: Self-pay | Admitting: Nurse Practitioner

## 2015-09-19 LAB — CEA: CEA1: 0.5 ng/mL (ref 0.0–4.7)

## 2015-09-19 LAB — HEPATITIS C ANTIBODY (REFLEX)

## 2015-09-19 LAB — SEDIMENTATION RATE: Sedimentation Rate-Westergren: 14 mm/hr (ref 0–15)

## 2015-09-19 LAB — C-REACTIVE PROTEIN: CRP: 2.5 mg/L (ref 0.0–4.9)

## 2015-09-19 LAB — HIV ANTIBODY (ROUTINE TESTING W REFLEX): HIV Screen 4th Generation wRfx: NONREACTIVE

## 2015-09-19 LAB — AFP TUMOR MARKER: AFP, Serum, Tumor Marker: 2.6 ng/mL (ref 0.0–8.3)

## 2015-09-19 LAB — APTT: APTT: 28 s (ref 24–33)

## 2015-09-19 NOTE — Telephone Encounter (Signed)
Called patient to followup on status d/t syncopal episode in clinic on 09/18/15. Left message on identified voicemail requesting return call.

## 2015-09-20 ENCOUNTER — Encounter (HOSPITAL_COMMUNITY): Payer: Self-pay | Admitting: Radiology

## 2015-09-20 ENCOUNTER — Ambulatory Visit (HOSPITAL_COMMUNITY)
Admission: RE | Admit: 2015-09-20 | Discharge: 2015-09-20 | Disposition: A | Discharge status: Home / Self Care | Payer: BLUE CROSS/BLUE SHIELD | Source: Ambulatory Visit | Attending: Hematology | Admitting: Hematology

## 2015-09-20 ENCOUNTER — Other Ambulatory Visit (HOSPITAL_COMMUNITY): Payer: Self-pay | Admitting: Diagnostic Radiology

## 2015-09-20 ENCOUNTER — Ambulatory Visit (HOSPITAL_COMMUNITY)
Admission: RE | Admit: 2015-09-20 | Discharge: 2015-09-20 | Disposition: A | Discharge status: Home / Self Care | Payer: BLUE CROSS/BLUE SHIELD | Source: Ambulatory Visit | Attending: Diagnostic Radiology | Admitting: Diagnostic Radiology

## 2015-09-20 ENCOUNTER — Other Ambulatory Visit: Payer: Self-pay | Admitting: Hematology

## 2015-09-20 DIAGNOSIS — R160 Hepatomegaly, not elsewhere classified: Secondary | ICD-10-CM

## 2015-09-20 DIAGNOSIS — C6931 Malignant neoplasm of right choroid: Secondary | ICD-10-CM

## 2015-09-21 ENCOUNTER — Other Ambulatory Visit: Payer: Self-pay | Admitting: General Surgery

## 2015-09-21 ENCOUNTER — Ambulatory Visit
Admission: RE | Admit: 2015-09-21 | Discharge: 2015-09-21 | Disposition: A | Discharge status: Home / Self Care | Payer: BLUE CROSS/BLUE SHIELD | Source: Ambulatory Visit | Attending: Hematology | Admitting: Hematology

## 2015-09-21 ENCOUNTER — Other Ambulatory Visit: Payer: Self-pay | Admitting: Hematology

## 2015-09-21 DIAGNOSIS — R160 Hepatomegaly, not elsewhere classified: Secondary | ICD-10-CM

## 2015-09-21 DIAGNOSIS — C6931 Malignant neoplasm of right choroid: Secondary | ICD-10-CM

## 2015-09-21 DIAGNOSIS — R911 Solitary pulmonary nodule: Secondary | ICD-10-CM | POA: Diagnosis not present

## 2015-09-21 LAB — GLUCOSE, CAPILLARY: Glucose-Capillary: 78 mg/dL (ref 65–99)

## 2015-09-21 MED ORDER — GADOBENATE DIMEGLUMINE 529 MG/ML IV SOLN
15.0000 mL | Freq: Once | INTRAVENOUS | Status: AC | PRN
  Administered 2015-09-21: 15 mL via INTRAVENOUS

## 2015-09-21 MED ORDER — FLUDEOXYGLUCOSE F - 18 (FDG) INJECTION
12.1500 | Freq: Once | INTRAVENOUS | Status: AC | PRN
  Administered 2015-09-21: 12.15 via INTRAVENOUS

## 2015-09-24 ENCOUNTER — Ambulatory Visit (HOSPITAL_COMMUNITY)
Admission: RE | Admit: 2015-09-24 | Discharge: 2015-09-24 | Disposition: A | Discharge status: Home / Self Care | Payer: BLUE CROSS/BLUE SHIELD | Source: Ambulatory Visit | Attending: Hematology | Admitting: Hematology

## 2015-09-24 ENCOUNTER — Encounter (HOSPITAL_COMMUNITY): Payer: Self-pay

## 2015-09-24 DIAGNOSIS — C6931 Malignant neoplasm of right choroid: Secondary | ICD-10-CM

## 2015-09-24 DIAGNOSIS — C439 Malignant melanoma of skin, unspecified: Secondary | ICD-10-CM | POA: Insufficient documentation

## 2015-09-24 DIAGNOSIS — R160 Hepatomegaly, not elsewhere classified: Secondary | ICD-10-CM | POA: Diagnosis not present

## 2015-09-24 LAB — CBC
HCT: 44.7 % (ref 39.0–52.0)
Hemoglobin: 15.6 g/dL (ref 13.0–17.0)
MCH: 29 pg (ref 26.0–34.0)
MCHC: 34.9 g/dL (ref 30.0–36.0)
MCV: 83.1 fL (ref 78.0–100.0)
PLATELETS: 239 10*3/uL (ref 150–400)
RBC: 5.38 MIL/uL (ref 4.22–5.81)
RDW: 12.2 % (ref 11.5–15.5)
WBC: 4.8 10*3/uL (ref 4.0–10.5)

## 2015-09-24 LAB — PROTIME-INR
INR: 1.02 (ref 0.00–1.49)
PROTHROMBIN TIME: 13.6 s (ref 11.6–15.2)

## 2015-09-24 LAB — APTT: APTT: 30 s (ref 24–37)

## 2015-09-24 MED ORDER — LIDOCAINE HCL (PF) 1 % IJ SOLN
INTRAMUSCULAR | Status: AC
  Filled 2015-09-24: qty 10

## 2015-09-24 MED ORDER — MIDAZOLAM HCL 2 MG/2ML IJ SOLN
INTRAMUSCULAR | Status: AC | PRN
  Administered 2015-09-24: 1 mg via INTRAVENOUS

## 2015-09-24 MED ORDER — SODIUM CHLORIDE 0.9 % IV SOLN: INTRAVENOUS | Status: DC

## 2015-09-24 MED ORDER — MIDAZOLAM HCL 2 MG/2ML IJ SOLN
INTRAMUSCULAR | Status: AC
  Filled 2015-09-24: qty 2

## 2015-09-24 MED ORDER — SODIUM CHLORIDE 0.9 % IV SOLN
INTRAVENOUS | Status: AC | PRN
  Administered 2015-09-24: 10 mL/h via INTRAVENOUS

## 2015-09-24 MED ORDER — FENTANYL CITRATE (PF) 100 MCG/2ML IJ SOLN
INTRAMUSCULAR | Status: AC
  Filled 2015-09-24: qty 2

## 2015-09-24 MED ORDER — FENTANYL CITRATE (PF) 100 MCG/2ML IJ SOLN
INTRAMUSCULAR | Status: AC | PRN
  Administered 2015-09-24: 50 ug via INTRAVENOUS

## 2015-09-24 NOTE — Discharge Instructions (Signed)

## 2015-09-24 NOTE — H&P (Signed)
Chief Complaint: Patient was seen in consultation today for liver lesion biopsy at the request of Brunetta Genera  Referring Physician(s): Brunetta Genera  Supervising Physician: Daryll Brod  History of Present Illness: Darius Fox is a 45 y.o. male   Pt diagnosed with choroidal malignant melanoma 2009 1 yr of proton beam treatment Followed by MD in [place] Now follows with [facility] and Dr Irene Limbo Noted RUQ pain for few days CT 09/13/15 revealed liver lesion 09/21/15 PET: IMPRESSION: 1. Moderately Hypermetabolic large solid mass within the RIGHT hepatic lobe is most concerning fro malignancy although melanoma typically has higher hypermetabolic activity. Comparison with prior cross-sectional imaging would be helpful. 2. Small 6 mm RIGHT upper lobe pulmonary nodule. Recommend attention  on follow-up. 3. No additional evidence of metastatic disease. Orbits appear normal.  Now scheduled for liver lesion biopsy    Past Medical History  Diagnosis Date  . GERD (gastroesophageal reflux disease)   . Cancer ([place]) 2009    eye - right (below retina)  . Mass     chest wall    Past Surgical History  Procedure Laterality Date  . Eye surgery  2009    due to cancer...  . Inguinal hernia repair Right 1994  . Mass excision  08/29/2011    Procedure: MINOR EXCISION OF MASS;  Surgeon: Adin Hector, MD;  Location: [place];  Service: General;  Laterality: Left;  local excision of left chest wall mass   . Mass excision N/A 04/11/2014    Procedure: MINOR EXCISION OF EPIDERMOID CYSTS FROM BACK X2;  Surgeon: Fanny Skates, MD;  Location: [place];  Service: General;  Laterality: N/A;  . Eye surgery      09/20/15 PER DR Carlis Abbott PT CAN ONLY BE SCANNED ON 1.5 TESLA    Allergies: Review of patient's allergies indicates no known allergies.  Medications: Prior to Admission medications   Not on File     Family History  Problem Relation  Age of Onset  . Prostate cancer Paternal Grandfather   . Diabetes Mother   . Hypertension Father     Social History   Social History  . Marital Status: Married    Spouse Name: N/A  . Number of Children: 1  . Years of Education: N/A   Occupational History  . Director of [employer]    Social History Main Topics  . Smoking status: Never Smoker   . Smokeless tobacco: Never Used  . Alcohol Use: Yes     Comment: occasional 2 -3 per week  . Drug Use: No  . Sexual Activity: Not Asked   Other Topics Concern  . None   Social History Narrative     Review of Systems: A 12 point ROS discussed and pertinent positives are indicated in the HPI above.  All other systems are negative.  Review of Systems  Constitutional: Negative for fever, diaphoresis, activity change, appetite change and fatigue.  Eyes: Negative for photophobia, pain and redness.  Respiratory: Negative for shortness of breath.   Gastrointestinal: Positive for abdominal pain.  Psychiatric/Behavioral: Negative for behavioral problems and confusion.    Vital Signs: BP 122/87 mmHg  Temp(Src) 97.9 F (36.6 C) (Oral)  Resp 15  Ht 5\' 11"  (1.803 m)  Wt 162 lb (73.483 kg)  BMI 22.60 kg/m2  SpO2 100%  Physical Exam  Constitutional: He is oriented to person, place, and time. He appears well-nourished.  Eyes: EOM are normal. Right eye exhibits no discharge.  Cardiovascular: Normal rate, regular rhythm and normal heart sounds.   Pulmonary/Chest: Effort normal and breath sounds normal. He has no wheezes.  Abdominal: Soft. Bowel sounds are normal. There is tenderness.  Musculoskeletal: Normal range of motion.  Neurological: He is alert and oriented to person, place, and time.  Skin: Skin is warm and dry.  Psychiatric: He has a normal mood and affect. His behavior is normal. Judgment and thought content normal.  Nursing note and vitals reviewed.   Mallampati Score:  MD Evaluation Airway: WNL Heart:  WNL Abdomen: WNL Chest/ Lungs: WNL ASA  Classification: 3 Mallampati/Airway Score: One  Imaging: Dg Eye Foreign Body  09/20/2015  CLINICAL DATA:  Metal working/exposure; clearance prior to MRI EXAM: ORBITS FOR FOREIGN BODY - 2 VIEW COMPARISON:  None. FINDINGS: There are 4 metallic densities identified within the right orbit. The left orbit is clear. IMPRESSION: 1. Four metallic densities are identified within the right orbit which correspond with the patient's known radiation markers. Electronically Signed   By: Kerby Moors M.D.   On: 09/20/2015 14:55   Mr Jeri Cos [id number] Contrast  09/21/2015  CLINICAL DATA:  History of choroidal melanoma right eye. History of proton beam treatment to right eye. Now with liver mass. Rule out metastatic disease. EXAM: MRI HEAD WITHOUT AND WITH CONTRAST TECHNIQUE: Multiplanar, multiecho pulse sequences of the brain and surrounding structures were obtained without and with intravenous contrast. CONTRAST:  8mL MULTIHANCE GADOBENATE DIMEGLUMINE 529 MG/ML IV SOLN COMPARISON:  None. FINDINGS: Orbit x-rays were reviewed from earlier today. There are four metal buttons within the right orbit, for localization of radiation treatment. The patient has had multiple MRI scans of the brain in [place] but none in this country. The patient has near complete loss of vision in the right eye. I discussed MRI safety with the patient. Given the history of prior successful MRI scans and lack of vision right eye, MRI was felt to be safe at this time. Ventricle size is normal.  Cerebral volume is normal. Negative for acute or chronic infarction Negative for demyelinating disease. Negative for intracranial hemorrhage. Negative for mass or edema. Postcontrast imaging reveals normal enhancement. No enhancing mass lesion. Vascular enhancement normal Pituitary normal in size. Cervical medullary junction normal. Paranasal sinuses clear. There are small metal markers within the right globe causing  slight artifact. No orbital mass is identified. Dedicated orbital imaging was not performed at this time. IMPRESSION: Negative for metastatic disease to the brain. No significant abnormality detected. Electronically Signed   By: Franchot Gallo M.D.   On: 09/21/2015 16:42   Ct Abdomen Pelvis W Contrast  09/13/2015  CLINICAL DATA:  Right-sided abdominal pain since last night. History of an inguinal hernia repair. EXAM: CT ABDOMEN AND PELVIS WITH CONTRAST TECHNIQUE: Multidetector CT imaging of the abdomen and pelvis was performed using the standard protocol following bolus administration of intravenous contrast. CONTRAST:  166mL OMNIPAQUE IOHEXOL 300 MG/ML  SOLN COMPARISON:  Ultrasound, 11/22/2013 FINDINGS: Lung bases:  Clear.  Heart normal size. Hepatobiliary: Large, hypo attenuating mildly heterogeneous mass arises from the posterior segment of the right liver lobe along its inferior margin. It measures 8.5 x 8.0 x 9.5 cm. There is stranding in the fat along the inferior margin of the liver adjacent the mass extending to anterior right para renal and perirenal spaces. No other liver masses or lesions. There is a discrete dependent gallstone. No gallbladder wall thickening or inflammatory changes. No bile duct dilation. Spleen, pancreas, adrenal glands:  Normal. Kidneys,  ureters, bladder: 3 mm nonobstructing stone in the midpole the right kidney. No renal masses. No other intrarenal stones. No hydronephrosis. There is a duplicated collecting system on the left. Ureters are normal course and caliber. Normal bladder. Lymph nodes:  No adenopathy. Ascites:  None. Gastrointestinal: Stomach, small bowel and colon are unremarkable. Normal appendix visualized. Musculoskeletal: Mild degenerative changes of the visualized spine and right hip. No osteoblastic or osteolytic lesions. IMPRESSION: 1. 9.5 cm mass arises from the posterior segment of the right liver lobe. This does not have typical imaging characteristics of a  hemangioma. There are also inflammatory type changes that extend from the inferior margin of the liver along the right anterior perirenal and para renal spaces. Mass may reflect neoplastic disease. An inflammatory or infectious mass is possible. 2. No other findings to explain right sided abdominal pain. Normal appendix visualized. 3. Nonobstructing right intrarenal stone.  Gallstone. Electronically Signed   By: Lajean Manes M.D.   On: 09/13/2015 19:33   Nm Pet Image Initial (pi) Whole Body  09/21/2015  CLINICAL DATA:  Subsequent treatment strategy for choroid melanoma. Liver lesion identified on CT 09/13/2015. EXAM: NUCLEAR MEDICINE PET WHOLE BODY TECHNIQUE: 12.5 mCi F-18 FDG was injected intravenously. Full-ring PET imaging was performed from the vertex to the feet after the radiotracer. CT data was obtained and used for attenuation correction and anatomic localization. FASTING BLOOD GLUCOSE:  Value:  70 mg/dl COMPARISON:  CT 08/17/2015 FINDINGS: Head/Neck: No hypermetabolic lymph nodes in the neck. No abnormal metabolic activity in the orbits. No cutaneous lesions. Chest: No hypermetabolic axillary or mediastinal lymph nodes. Within the RIGHT upper lobe, 6 mm nodule image 141, series 3 does not have associated metabolic activity. Abdomen/Pelvis: Large relative hypodense solid lesion expanding the inferior RIGHT hepatic lobe measures 8.4 x 9.2 cm which compares to 8.0 by 9.9 cm on prior for no significant interval change. This lesion is moderately hypermetabolic when compared to the liver with SUV max equals 6.0 compared to the normal liver parenchyma at 2.9. No hypermetabolic abdominal pelvic lymph nodes. Normal metabolic activity of the liver spleen. Incidental note of gallstones. Skeleton: No focal hypermetabolic activity to suggest skeletal metastasis. Extremities: No hypermetabolic activity to suggest metastasis. IMPRESSION: 1. Moderately Hypermetabolic large solid mass within the RIGHT hepatic lobe is  most concerning fro malignancy although melanoma typically has higher hypermetabolic activity. Comparison with prior cross-sectional imaging would be helpful. 2. Small 6 mm RIGHT upper lobe pulmonary nodule. Recommend attention on follow-up. 3. No additional evidence of metastatic disease. Orbits appear normal. Electronically Signed   By: Suzy Bouchard M.D.   On: 09/21/2015 11:26    Labs:  CBC:  Recent Labs  09/13/15 1722 09/18/15 1535 09/24/15 0615  WBC 11.2* 6.3 4.8  HGB 15.5 15.8 15.6  HCT 43.9 44.1 44.7  PLT 207 231 239    COAGS:  Recent Labs  09/18/15 1535 09/24/15 0615  INR 1.00* 1.02  APTT  --  30    BMP:  Recent Labs  09/13/15 1722 09/18/15 1539  NA 137 140  K 3.4* 3.9  CL 104  --   CO2 26 28  GLUCOSE 99 80  BUN 14 12.5  CALCIUM 9.1 9.4  CREATININE 0.83 0.8  GFRNONAA >60  --   GFRAA >60  --     LIVER FUNCTION TESTS:  Recent Labs  09/13/15 1722 09/18/15 1539  BILITOT 0.8 0.70  AST 23 17  ALT 29 19  ALKPHOS 52 59  PROT 6.4* 7.4  ALBUMIN 3.8 4.0    TUMOR MARKERS: No results for input(s): AFPTM, CEA, CA199, CHROMGRNA in the last 8760 hours.  Assessment and Plan:  Hx Choroidal malignant melanoma 2009 Now with new +PET liver lesion Scheduled for bx of same Risks and Benefits discussed with the patient including, but not limited to bleeding, infection, damage to adjacent structures or low yield requiring additional tests. All of the patient's questions were answered, patient is agreeable to proceed. Consent signed and in chart.   Thank you for this interesting consult.  I greatly enjoyed meeting Darius Fox and look forward to participating in their care.  A copy of this report was sent to the requesting provider on this date.  Electronically Signed: Monia Sabal A 09/24/2015, 7:26 AM   I spent a total of  30 Minutes   in face to face in clinical consultation, greater than 50% of which was counseling/coordinating care for liver  lesion bx

## 2015-09-24 NOTE — Sedation Documentation (Signed)
Patient is resting comfortably. Pt's BP decreased to 74/49 after initial medication given. Dr. Annamaria Boots at bedside and aware, 200 cc bolus of NS given

## 2015-09-24 NOTE — Sedation Documentation (Signed)
Patient denies pain and is resting comfortably.  

## 2015-09-24 NOTE — Procedures (Signed)
Successful [place] RT LIVER MASS 18G CORE BX No comp Stable Path pending Full report in PACS

## 2015-09-24 NOTE — Sedation Documentation (Signed)
Patient denies pain and is resting comfortably. SBP remains low, Dr. Annamaria Boots at bedside and aware

## 2015-09-24 NOTE — Sedation Documentation (Addendum)
Pt had a vagal response to procedure. Pt states "this happens when I get nervous." Pt very anxious as procedure started, NS bolus given, cool wash cloths placed to forehead and neck. Pt remained alert through out procedure. BP increased to 108/79 after NS bolus

## 2015-09-27 ENCOUNTER — Telehealth: Payer: Self-pay | Admitting: *Deleted

## 2015-09-27 NOTE — Telephone Encounter (Signed)
Faxed over new patient referral information to [facility] (MD Richardson Landry). Needed this information before making appointment.

## 2015-10-01 ENCOUNTER — Encounter: Payer: Self-pay | Admitting: Hematology

## 2015-10-01 ENCOUNTER — Ambulatory Visit (HOSPITAL_BASED_OUTPATIENT_CLINIC_OR_DEPARTMENT_OTHER): Payer: BLUE CROSS/BLUE SHIELD | Admitting: Hematology

## 2015-10-01 VITALS — BP 110/68 | HR 75 | Temp 98.1°F | Resp 18 | Ht 71.0 in | Wt 166.7 lb

## 2015-10-01 DIAGNOSIS — K5903 Drug induced constipation: Secondary | ICD-10-CM

## 2015-10-01 DIAGNOSIS — R911 Solitary pulmonary nodule: Secondary | ICD-10-CM

## 2015-10-01 DIAGNOSIS — C6931 Malignant neoplasm of right choroid: Secondary | ICD-10-CM | POA: Diagnosis not present

## 2015-10-01 DIAGNOSIS — K219 Gastro-esophageal reflux disease without esophagitis: Secondary | ICD-10-CM

## 2015-10-01 DIAGNOSIS — C787 Secondary malignant neoplasm of liver and intrahepatic bile duct: Secondary | ICD-10-CM

## 2015-10-01 MED ORDER — LORAZEPAM 1 MG PO TABS: 1.0000 mg | ORAL_TABLET | Freq: Three times a day (TID) | ORAL | Status: DC

## 2015-10-01 MED ORDER — OXYCODONE HCL 5 MG PO TABS: 5.0000 mg | ORAL_TABLET | ORAL | Status: DC | PRN

## 2015-10-08 ENCOUNTER — Encounter (HOSPITAL_COMMUNITY): Payer: Self-pay

## 2015-10-09 NOTE — Progress Notes (Signed)
Marland Kitchen    HEMATOLOGY/ONCOLOGY CONSULTATION NOTE  Date of Service: 10/01/2015  Patient Care Team: Darius Fess, MD as PCP - General (Family Medicine)  CHIEF COMPLAINTS/PURPOSE OF CONSULTATION:  History of right eye choroidal melanoma New liver mass  HISTORY OF PRESENTING ILLNESS: as per my previous note  Darius Fox is a wonderful 45 y.o. male who has been referred to [place] by Darius Bill, MD for evaluation and management of newly noted liver mass.  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 The initial lesion thickening was 12 x 10 x 1.6 mm status post proton beam radiation therapy in [place] in 2009 with plaque in place for 1 week. 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 Last treatment was in 2012. Patient is also noted to have optic atrophy. He has fairly limited vision in his right eye restricted to light perception and some movement perception.  Patient notes that he had an MRI of the brain at the time of his initial diagnosis which was negative. He has been following up with Dr. Gerarda Fox at  [facility] for his ophthalmology cares. He was last seen in June 2016 and has an appointment for followup on 09/18/2015 for re-evaluation.  Patient was initially getting every 6 months ultrasounds of his liver and then every year.  His last ultrasound of the liver was done in in June 2015 which showed no focal hepatic masses and no intrahepatic biliary ductal dilatation.  Patient was noted to have some small mobile gallstones in the gallbladder with no gallbladder thickening.  Patient notes that he previously has had an EGD and colonoscopy and prostate examination within the last 5 years all of which have been within normal limits.  Patient was in his usual state of  health until a few months ago when he had some mild increase in fatigue which he attributed it to being very busy at work and not getting adequate sleep.  He has noted some right upper abdominal and epigastric discomfort on and off for the last couple of weeks.he presented to the emergency room on 09/13/2015 with right-sided abdominal pain.  He had a CT scan of the abdomen and pelvis which showed a 9.5 cm mass arising from the posterior segment of the right liver lobe.  There were also some inflammatory changes that extend from the inferior margin of the liver along the right anterior perirenal and pararenal spaces.  Patient was referred to our clinic for consultation from the emergency room due to concern for metastatic melanoma/other neoplastic lesion.  Patient notes minimal right upper abdominal pain which is controlled with oxycodone that he uses only at nighttime.  Most significant weight loss.  No change in bowel habits.  No blood in the stools.  No blood in the urine.  No headaches.  No focal neurological deficits.  Patient is quite educated about his medical condition.   INTERVAL HISTORY  Mr Darius Fox is here for follow-up after his imaging studies on biopsy of his liver lesion. Biopsy was consistent with our concern for metastatic melanoma likely from his previous uveal melanoma. No significant evidence of disease outside the liver. He notes that his pain has been well-controlled. Has when necessary oxycodone available pain and when necessary Ativan available for anxiety. No other acute new symptoms.  MEDICAL HISTORY:  Past Medical  History  Diagnosis Date  . GERD (gastroesophageal reflux disease)   . Cancer ([place]) 2009    eye - right (below retina)  . Mass     chest wall    SURGICAL HISTORY: Past Surgical History  Procedure Laterality Date  . Eye surgery  2009    due to cancer...  . Inguinal hernia repair Right 1994  . Mass excision  08/29/2011    Procedure: MINOR EXCISION OF MASS;   Surgeon: Adin Hector, MD;  Location: [facility];  Service: General;  Laterality: Left;  local excision of left chest wall mass   . Mass excision N/A 04/11/2014    Procedure: MINOR EXCISION OF EPIDERMOID CYSTS FROM BACK X2;  Surgeon: Fanny Skates, MD;  Location: [facility];  Service: General;  Laterality: N/A;  . Eye surgery      09/20/15 PER DR Carlis Abbott PT CAN ONLY BE SCANNED ON 1.5 TESLA    SOCIAL HISTORY: Social History   Social History  . Marital Status: Married    Spouse Name: N/A  . Number of Children: 1  . Years of Education: N/A   Occupational History  . Director of [place]    Social History Main Topics  . Smoking status: Never Smoker   . Smokeless tobacco: Never Used  . Alcohol Use: Yes     Comment: occasional 2 -3 per week  . Drug Use: No  . Sexual Activity: Not on file   Other Topics Concern  . Not on file   Social History Narrative  Works as a Sales promotion account executive for [employer]. Has a 89-year-old son.  He is married. Originally from [place].   FAMILY HISTORY: Family History  Problem Relation Age of Onset  . Prostate cancer Paternal Grandfather   . Diabetes Mother   . Hypertension Father     ALLERGIES:  has No Known Allergies.  MEDICATIONS:  Current Outpatient Prescriptions  Medication Sig Dispense Refill  . LORazepam (ATIVAN) 1 MG tablet Take 1 tablet (1 mg total) by mouth every 8 (eight) hours. For anxiety 30 tablet 0  . oxyCODONE (OXY IR/ROXICODONE) 5 MG immediate release tablet Take 1-2 tablets (5-10 mg total) by mouth every 4 (four) hours as needed for moderate pain, severe pain or breakthrough pain. 30 tablet 0   No current facility-administered medications for this visit.    REVIEW OF SYSTEMS:    10 Point review of Systems was done is negative except as noted above.  PHYSICAL EXAMINATION: ECOG PERFORMANCE STATUS: 0 - Asymptomatic  . Filed Vitals:   10/01/15 1624  BP: 110/68  Pulse:  75  Temp: 98.1 F (36.7 C)  Resp: 18   Filed Weights   10/01/15 1624  Weight: 166 lb 11.2 oz (75.615 kg)   .Body mass index is 23.26 kg/(m^2).  GENERAL:alert, in no acute distress and comfortable SKIN: skin color, texture, turgor are normal, no rashes or significant lesions EYES: normal, conjunctiva are pink and non-injected, sclera clear OROPHARYNX:no exudate, no erythema and lips, buccal mucosa, and tongue normal  NECK: supple, no JVD, thyroid normal size, non-tender, without nodularity LYMPH:  no palpable lymphadenopathy in the cervical, axillary or inguinal LUNGS: clear to auscultation with normal respiratory effort HEART: regular rate & rhythm,  no murmurs and no lower extremity edema ABDOMEN: abdomen soft, non-tender, normoactive bowel sounds , minimal tenderness to palpation over her right upper quadrant.  No guarding or rigidity or rebound. Musculoskeletal: no cyanosis of digits and no clubbing  PSYCH: alert & oriented x 3 with fluent speech NEURO: no focal motor/sensory deficits  LABORATORY DATA:  I have reviewed the data as listed  . CBC Latest Ref Rng 09/24/2015 09/18/2015 09/13/2015  WBC 4.0 - 10.5 K/uL 4.8 6.3 11.2(H)  Hemoglobin 13.0 - 17.0 g/dL 15.6 15.8 15.5  Hematocrit 39.0 - 52.0 % 44.7 44.1 43.9  Platelets 150 - 400 K/uL 239 231 207    . CMP Latest Ref Rng 09/18/2015 09/13/2015  Glucose 70 - 140 mg/dl 80 99  BUN 7.0 - 26.0 mg/dL 12.5 14  Creatinine 0.7 - 1.3 mg/dL 0.8 0.83  Sodium 136 - 145 mEq/L 140 137  Potassium 3.5 - 5.1 mEq/L 3.9 3.4(L)  Chloride 101 - 111 mmol/L - 104  CO2 22 - 29 mEq/L 28 26  Calcium 8.4 - 10.4 mg/dL 9.4 9.1  Total Protein 6.4 - 8.3 g/dL 7.4 6.4(L)  Total Bilirubin 0.20 - 1.20 mg/dL 0.70 0.8  Alkaline Phos 40 - 150 U/L 59 52  AST 5 - 34 U/L 17 23  ALT 0 - 55 U/L 19 29   Component     Latest Ref Rng 09/18/2015  HCV Ab     0.0 - 0.9 s/co ratio <0.1  Comment      Comment  LDH     125 - 245 U/L 163  CRP     0.0 - 4.9 mg/L 2.5    Sed Rate     0 - 15 mm/hr 14  AFP, Serum, Tumor Marker     0.0 - 8.3 ng/mL 2.6  CEA     0.0 - 4.7 ng/mL 0.5  HIV     Non Reactive Non Reactive           RADIOGRAPHIC STUDIES: I have personally reviewed the radiological images as listed and agreed with the findings in the report. Dg Eye Foreign Body  09/20/2015  CLINICAL DATA:  Metal working/exposure; clearance prior to MRI EXAM: ORBITS FOR FOREIGN BODY - 2 VIEW COMPARISON:  None. FINDINGS: There are 4 metallic densities identified within the right orbit. The left orbit is clear. IMPRESSION: 1. Four metallic densities are identified within the right orbit which correspond with the patient's known radiation markers. Electronically Signed   By: Kerby Moors M.D.   On: 09/20/2015 14:55   Mr Jeri Cos VB Contrast  09/21/2015  CLINICAL DATA:  History of choroidal melanoma right eye. History of proton beam treatment to right eye. Now with liver mass. Rule out metastatic disease. EXAM: MRI HEAD WITHOUT AND WITH CONTRAST TECHNIQUE: Multiplanar, multiecho pulse sequences of the brain and surrounding structures were obtained without and with intravenous contrast. CONTRAST:  25m MULTIHANCE GADOBENATE DIMEGLUMINE 529 MG/ML IV SOLN COMPARISON:  None. FINDINGS: Orbit x-rays were reviewed from earlier today. There are four metal buttons within the right orbit, for localization of radiation treatment. The patient [place]. The patient has near complete loss of vision in the right eye. I discussed MRI safety with the patient. Given the history of prior successful MRI scans and lack of vision right eye, MRI was felt to be safe at this time. Ventricle size is normal.  Cerebral volume is normal. Negative for acute or chronic infarction Negative for demyelinating disease. Negative for intracranial hemorrhage. Negative for mass or edema. Postcontrast imaging reveals normal enhancement. No  enhancing mass lesion. Vascular enhancement normal Pituitary normal in size. Cervical medullary junction normal. Paranasal sinuses  clear. There are small metal markers within the right globe causing slight artifact. No orbital mass is identified. Dedicated orbital imaging was not performed at this time. IMPRESSION: Negative for metastatic disease to the brain. No significant abnormality detected. Electronically Signed   By: Franchot Gallo M.D.   On: 09/21/2015 16:42   Ct Abdomen Pelvis W Contrast  09/13/2015  CLINICAL DATA:  Right-sided abdominal pain since last night. History of an inguinal hernia repair. EXAM: CT ABDOMEN AND PELVIS WITH CONTRAST TECHNIQUE: Multidetector CT imaging of the abdomen and pelvis was performed using the standard protocol following bolus administration of intravenous contrast. CONTRAST:  169m OMNIPAQUE IOHEXOL 300 MG/ML  SOLN COMPARISON:  Ultrasound, 11/22/2013 FINDINGS: Lung bases:  Clear.  Heart normal size. Hepatobiliary: Large, hypo attenuating mildly heterogeneous mass arises from the posterior segment of the right liver lobe along its inferior margin. It measures 8.5 x 8.0 x 9.5 cm. There is stranding in the fat along the inferior margin of the liver adjacent the mass extending to anterior right para renal and perirenal spaces. No other liver masses or lesions. There is a discrete dependent gallstone. No gallbladder wall thickening or inflammatory changes. No bile duct dilation. Spleen, pancreas, adrenal glands:  Normal. Kidneys, ureters, bladder: 3 mm nonobstructing stone in the midpole the right kidney. No renal masses. No other intrarenal stones. No hydronephrosis. There is a duplicated collecting system on the left. Ureters are normal course and caliber. Normal bladder. Lymph nodes:  No adenopathy. Ascites:  None. Gastrointestinal: Stomach, small bowel and colon are unremarkable. Normal appendix visualized. Musculoskeletal: Mild degenerative changes of the visualized spine  and right hip. No osteoblastic or osteolytic lesions. IMPRESSION: 1. 9.5 cm mass arises from the posterior segment of the right liver lobe. This does not have typical imaging characteristics of a hemangioma. There are also inflammatory type changes that extend from the inferior margin of the liver along the right anterior perirenal and para renal spaces. Mass may reflect neoplastic disease. An inflammatory or infectious mass is possible. 2. No other findings to explain right sided abdominal pain. Normal appendix visualized. 3. Nonobstructing right intrarenal stone.  Gallstone. Electronically Signed   By: DLajean ManesM.D.   On: 09/13/2015 19:33   Nm Pet Image Initial (pi) Whole Body  09/21/2015  CLINICAL DATA:  Subsequent treatment strategy for choroid melanoma. Liver lesion identified on CT 09/13/2015. EXAM: NUCLEAR MEDICINE PET WHOLE BODY TECHNIQUE: 12.5 mCi F-18 FDG was injected intravenously. Full-ring PET imaging was performed from the vertex to the feet after the radiotracer. CT data was obtained and used for attenuation correction and anatomic localization. FASTING BLOOD GLUCOSE:  Value:  70 mg/dl COMPARISON:  CT 08/17/2015 FINDINGS: Head/Neck: No hypermetabolic lymph nodes in the neck. No abnormal metabolic activity in the orbits. No cutaneous lesions. Chest: No hypermetabolic axillary or mediastinal lymph nodes. Within the RIGHT upper lobe, 6 mm nodule image 141, series 3 does not have associated metabolic activity. Abdomen/Pelvis: Large relative hypodense solid lesion expanding the inferior RIGHT hepatic lobe measures 8.4 x 9.2 cm which compares to 8.0 by 9.9 cm on prior for no significant interval change. This lesion is moderately hypermetabolic when compared to the liver with SUV max equals 6.0 compared to the normal liver parenchyma at 2.9. No hypermetabolic abdominal pelvic lymph nodes. Normal metabolic activity of the liver spleen. Incidental note of gallstones. Skeleton: No focal hypermetabolic  activity to suggest skeletal metastasis. Extremities: No hypermetabolic activity to suggest metastasis. IMPRESSION: 1. Moderately Hypermetabolic large solid mass  within the RIGHT hepatic lobe is most concerning fro malignancy although melanoma typically has higher hypermetabolic activity. Comparison with prior cross-sectional imaging would be helpful. 2. Small 6 mm RIGHT upper lobe pulmonary nodule. Recommend attention on follow-up. 3. No additional evidence of metastatic disease. Orbits appear normal. Electronically Signed   By: Suzy Bouchard M.D.   On: 09/21/2015 11:26   US Biopsy  09/24/2015  INDICATION: History of the uveal melanoma, right upper quadrant pain, 9 cm right liver mass concerning for metastatic disease EXAM: ULTRASOUND BIOPSY CORE LIVER MEDICATIONS: 1% lidocaine locally ANESTHESIA/SEDATION: Moderate (conscious) sedation was employed during this procedure. A total of Versed 1.0 mg and Fentanyl 50 mcg was administered intravenously. Moderate Sedation Time: 15 minutes. The patient's level of consciousness and vital signs were monitored continuously by radiology nursing throughout the procedure under my direct supervision. FLUOROSCOPY TIME:  Fluoroscopy Time: None. COMPLICATIONS: None immediate. PROCEDURE: Informed written consent was obtained from the patient after a thorough discussion of the procedural risks, benefits and alternatives. All questions were addressed. Maximal Sterile Barrier Technique was utilized including caps, mask, sterile gowns, sterile gloves, sterile drape, hand hygiene and skin antiseptic. A timeout was performed prior to the initiation of the procedure. Previous imaging reviewed. Preliminary ultrasound performed. The right inferior hepatic mass was localized with ultrasound in the mid axillary line through a lower intercostal space. Under sterile conditions and local anesthesia, a 17 gauge 6.8 cm access needle was advanced into the lesion. Needle position confirmed with  ultrasound. Images obtained for documentation. 3 18 gauge core biopsies obtained under direct ultrasound. Samples placed in formalin. Needle removed. Postprocedure imaging demonstrates no hemorrhage or hematoma. Patient tolerated the biopsy well. IMPRESSION: Successful ultrasound right hepatic mass 18 gauge core biopsies Electronically Signed   By: Jerilynn Mages.  Shick M.D.   On: 09/24/2015 09:14    ASSESSMENT & PLAN:   45 year old [place] man with history of choroidal melanoma diagnosed in 2009 presented with new large hepatic mass.  #1 metastatic malignant melanoma now presenting with Right sided 9.5 cm newly noted hepatic mass   Given his history of choroidal melanoma this likely represents a metastatic uveal melanoma. No significant disease outside of the liver noted. Small 6 mm indeterminate right upper lobe lung lesion. PET scan and MRI of the brain as noted above.  #2 history of right eye choroidal melanoma diagnosed in 2009 had plaque placement and proton beam radiation therapy in 2009 at [place].  He developed a radiation retinopathy which was treated with laser ablation and Avastin ( completed in 2012).  Also noted to have optic atrophy and minimal residual vision in his rt eye.  #3 GERD -controlled with lifestyle modifications.  #4 Opiate related constipation. Controlled.  PLAN - Discussed the diagnosis, imaging studies, natural history, prognosis and brought be the treatment options with the patient. -He was given additional reading material for his education. -Emotional support was provided and his questions were answered. He is dealing with this point to determined fashion. -He was referred to Dr. Richardson Landry at [facility] from hepatobiliary surgery for evaluation of possible resection of his isolated hepatic metastasis . I subsequently talked with Dr. Manuella Ghazi and he was sent the images on a CD .he notes he will evaluate this to determine resectability. If not resectable he noted that [facility] had a  clinical trial with isolated hepatic perfusion therapy that he could offer to the patient . He also noted that he would like to have the medical oncologist at [facility] weight in  regarding this treatment as well. -PD L1 expression negative as expected . -Pending results on  BRaf mutation, KIT mutation, PDL1 testing on sample. -patient notes that he did follow up with ophthalmologist Dr. Cordelia Pen did not suggest any additional new treatments at this time. We'll be seeing him more frequently. -We will reconnect with the patient after his evaluation at [facility]. -Oxycodone when necessary for pain -Ativan when necessary for anxiety.  RTC with Dr Irene Limbo after evaluation at [facility] depending on next steps in treatment.  All of the patients questions were answered to his apparent satisfaction. The patient knows to call the clinic with any problems, questions or concerns.  I spent 45 minutes counseling the patient face to face. The total time spent in the appointment was 50 minutes and more than 50% was on counseling and direct patient cares.    Sullivan Lone MD [place] AAHIVMS [facility] Hematology/Oncology Physician [facility]  (Office):       [phone] (Work cell):  [phone] (Fax):           [phone]

## 2015-10-10 ENCOUNTER — Encounter (HOSPITAL_COMMUNITY): Payer: Self-pay

## 2015-10-19 ENCOUNTER — Encounter (HOSPITAL_COMMUNITY): Payer: Self-pay

## 2015-10-23 DIAGNOSIS — K219 Gastro-esophageal reflux disease without esophagitis: Secondary | ICD-10-CM | POA: Diagnosis not present

## 2015-10-23 DIAGNOSIS — D7389 Other diseases of spleen: Secondary | ICD-10-CM | POA: Diagnosis not present

## 2015-10-23 DIAGNOSIS — K8010 Calculus of gallbladder with chronic cholecystitis without obstruction: Secondary | ICD-10-CM | POA: Diagnosis not present

## 2015-10-23 DIAGNOSIS — Z923 Personal history of irradiation: Secondary | ICD-10-CM | POA: Diagnosis not present

## 2015-10-23 DIAGNOSIS — R110 Nausea: Secondary | ICD-10-CM | POA: Diagnosis not present

## 2015-10-23 DIAGNOSIS — Z85840 Personal history of malignant neoplasm of eye: Secondary | ICD-10-CM | POA: Diagnosis not present

## 2015-10-23 DIAGNOSIS — H3500 Unspecified background retinopathy: Secondary | ICD-10-CM | POA: Diagnosis not present

## 2015-10-23 DIAGNOSIS — R42 Dizziness and giddiness: Secondary | ICD-10-CM | POA: Diagnosis not present

## 2015-10-23 DIAGNOSIS — C787 Secondary malignant neoplasm of liver and intrahepatic bile duct: Secondary | ICD-10-CM | POA: Diagnosis not present

## 2015-10-26 ENCOUNTER — Encounter (HOSPITAL_COMMUNITY): Payer: Self-pay

## 2015-10-26 DIAGNOSIS — K8010 Calculus of gallbladder with chronic cholecystitis without obstruction: Secondary | ICD-10-CM | POA: Diagnosis not present

## 2015-10-26 DIAGNOSIS — C787 Secondary malignant neoplasm of liver and intrahepatic bile duct: Secondary | ICD-10-CM | POA: Diagnosis not present

## 2015-11-02 ENCOUNTER — Encounter (HOSPITAL_COMMUNITY): Payer: Self-pay

## 2015-11-16 DIAGNOSIS — J9811 Atelectasis: Secondary | ICD-10-CM | POA: Diagnosis not present

## 2015-11-16 DIAGNOSIS — C787 Secondary malignant neoplasm of liver and intrahepatic bile duct: Secondary | ICD-10-CM | POA: Diagnosis not present

## 2015-11-16 DIAGNOSIS — C799 Secondary malignant neoplasm of unspecified site: Secondary | ICD-10-CM | POA: Diagnosis not present

## 2015-11-16 DIAGNOSIS — R918 Other nonspecific abnormal finding of lung field: Secondary | ICD-10-CM | POA: Diagnosis not present

## 2015-11-19 DIAGNOSIS — K7689 Other specified diseases of liver: Secondary | ICD-10-CM | POA: Diagnosis not present

## 2015-11-19 DIAGNOSIS — R61 Generalized hyperhidrosis: Secondary | ICD-10-CM | POA: Diagnosis not present

## 2015-11-19 DIAGNOSIS — C801 Malignant (primary) neoplasm, unspecified: Secondary | ICD-10-CM | POA: Diagnosis not present

## 2015-11-19 DIAGNOSIS — C787 Secondary malignant neoplasm of liver and intrahepatic bile duct: Secondary | ICD-10-CM | POA: Diagnosis not present

## 2015-11-19 DIAGNOSIS — J90 Pleural effusion, not elsewhere classified: Secondary | ICD-10-CM | POA: Diagnosis not present

## 2015-11-21 DIAGNOSIS — Z4682 Encounter for fitting and adjustment of non-vascular catheter: Secondary | ICD-10-CM | POA: Diagnosis not present

## 2015-11-21 DIAGNOSIS — R188 Other ascites: Secondary | ICD-10-CM | POA: Diagnosis not present

## 2015-11-21 DIAGNOSIS — Z79899 Other long term (current) drug therapy: Secondary | ICD-10-CM | POA: Diagnosis not present

## 2015-11-21 DIAGNOSIS — J90 Pleural effusion, not elsewhere classified: Secondary | ICD-10-CM | POA: Diagnosis not present

## 2015-11-21 DIAGNOSIS — K7689 Other specified diseases of liver: Secondary | ICD-10-CM | POA: Diagnosis not present

## 2015-11-21 DIAGNOSIS — R918 Other nonspecific abnormal finding of lung field: Secondary | ICD-10-CM | POA: Diagnosis not present

## 2015-11-27 DIAGNOSIS — H3589 Other specified retinal disorders: Secondary | ICD-10-CM | POA: Diagnosis not present

## 2015-11-27 DIAGNOSIS — T66XXXS Radiation sickness, unspecified, sequela: Secondary | ICD-10-CM | POA: Diagnosis not present

## 2015-11-27 DIAGNOSIS — C799 Secondary malignant neoplasm of unspecified site: Secondary | ICD-10-CM | POA: Diagnosis not present

## 2015-11-27 DIAGNOSIS — C6931 Malignant neoplasm of right choroid: Secondary | ICD-10-CM | POA: Diagnosis not present

## 2015-12-11 DIAGNOSIS — C799 Secondary malignant neoplasm of unspecified site: Secondary | ICD-10-CM | POA: Diagnosis not present

## 2015-12-11 DIAGNOSIS — C801 Malignant (primary) neoplasm, unspecified: Secondary | ICD-10-CM | POA: Diagnosis not present

## 2015-12-11 DIAGNOSIS — C787 Secondary malignant neoplasm of liver and intrahepatic bile duct: Secondary | ICD-10-CM | POA: Diagnosis not present

## 2015-12-11 DIAGNOSIS — K7689 Other specified diseases of liver: Secondary | ICD-10-CM | POA: Diagnosis not present

## 2016-02-05 DIAGNOSIS — D2272 Melanocytic nevi of left lower limb, including hip: Secondary | ICD-10-CM | POA: Diagnosis not present

## 2016-02-05 DIAGNOSIS — D235 Other benign neoplasm of skin of trunk: Secondary | ICD-10-CM | POA: Diagnosis not present

## 2016-02-05 DIAGNOSIS — D485 Neoplasm of uncertain behavior of skin: Secondary | ICD-10-CM | POA: Diagnosis not present

## 2016-02-05 DIAGNOSIS — Z85820 Personal history of malignant melanoma of skin: Secondary | ICD-10-CM | POA: Diagnosis not present

## 2016-02-05 DIAGNOSIS — L821 Other seborrheic keratosis: Secondary | ICD-10-CM | POA: Diagnosis not present

## 2016-02-05 DIAGNOSIS — D225 Melanocytic nevi of trunk: Secondary | ICD-10-CM | POA: Diagnosis not present

## 2016-02-05 DIAGNOSIS — L814 Other melanin hyperpigmentation: Secondary | ICD-10-CM | POA: Diagnosis not present

## 2016-02-27 DIAGNOSIS — R911 Solitary pulmonary nodule: Secondary | ICD-10-CM | POA: Diagnosis not present

## 2016-02-27 DIAGNOSIS — C439 Malignant melanoma of skin, unspecified: Secondary | ICD-10-CM | POA: Diagnosis not present

## 2016-02-27 DIAGNOSIS — C787 Secondary malignant neoplasm of liver and intrahepatic bile duct: Secondary | ICD-10-CM | POA: Diagnosis not present

## 2016-02-27 DIAGNOSIS — C799 Secondary malignant neoplasm of unspecified site: Secondary | ICD-10-CM | POA: Diagnosis not present

## 2016-03-04 DIAGNOSIS — T66XXXS Radiation sickness, unspecified, sequela: Secondary | ICD-10-CM | POA: Diagnosis not present

## 2016-03-04 DIAGNOSIS — H3589 Other specified retinal disorders: Secondary | ICD-10-CM | POA: Diagnosis not present

## 2016-03-04 DIAGNOSIS — C6931 Malignant neoplasm of right choroid: Secondary | ICD-10-CM | POA: Diagnosis not present

## 2016-03-24 ENCOUNTER — Encounter: Payer: Self-pay | Admitting: Nurse Practitioner

## 2016-04-14 DIAGNOSIS — C6931 Malignant neoplasm of right choroid: Secondary | ICD-10-CM | POA: Diagnosis not present

## 2016-04-14 DIAGNOSIS — H5213 Myopia, bilateral: Secondary | ICD-10-CM | POA: Diagnosis not present

## 2016-04-14 DIAGNOSIS — H5010 Unspecified exotropia: Secondary | ICD-10-CM | POA: Diagnosis not present

## 2016-04-14 DIAGNOSIS — T66XXXS Radiation sickness, unspecified, sequela: Secondary | ICD-10-CM | POA: Diagnosis not present

## 2016-05-21 DIAGNOSIS — C801 Malignant (primary) neoplasm, unspecified: Secondary | ICD-10-CM | POA: Diagnosis not present

## 2016-05-21 DIAGNOSIS — C799 Secondary malignant neoplasm of unspecified site: Secondary | ICD-10-CM | POA: Diagnosis not present

## 2016-05-30 DIAGNOSIS — D2222 Melanocytic nevi of left ear and external auricular canal: Secondary | ICD-10-CM | POA: Diagnosis not present

## 2016-05-30 DIAGNOSIS — D225 Melanocytic nevi of trunk: Secondary | ICD-10-CM | POA: Diagnosis not present

## 2016-05-30 DIAGNOSIS — D2272 Melanocytic nevi of left lower limb, including hip: Secondary | ICD-10-CM | POA: Diagnosis not present

## 2016-05-30 DIAGNOSIS — D1801 Hemangioma of skin and subcutaneous tissue: Secondary | ICD-10-CM | POA: Diagnosis not present

## 2016-05-30 DIAGNOSIS — D485 Neoplasm of uncertain behavior of skin: Secondary | ICD-10-CM | POA: Diagnosis not present

## 2016-06-18 DIAGNOSIS — H3589 Other specified retinal disorders: Secondary | ICD-10-CM | POA: Diagnosis not present

## 2016-06-18 DIAGNOSIS — T66XXXS Radiation sickness, unspecified, sequela: Secondary | ICD-10-CM | POA: Diagnosis not present

## 2016-06-18 DIAGNOSIS — C6931 Malignant neoplasm of right choroid: Secondary | ICD-10-CM | POA: Diagnosis not present

## 2016-06-27 DIAGNOSIS — Z0000 Encounter for general adult medical examination without abnormal findings: Secondary | ICD-10-CM | POA: Diagnosis not present

## 2016-06-27 DIAGNOSIS — C787 Secondary malignant neoplasm of liver and intrahepatic bile duct: Secondary | ICD-10-CM | POA: Diagnosis not present

## 2016-08-20 DIAGNOSIS — C799 Secondary malignant neoplasm of unspecified site: Secondary | ICD-10-CM | POA: Diagnosis not present

## 2016-08-20 DIAGNOSIS — C6931 Malignant neoplasm of right choroid: Secondary | ICD-10-CM | POA: Diagnosis not present

## 2016-08-20 DIAGNOSIS — Z9889 Other specified postprocedural states: Secondary | ICD-10-CM | POA: Diagnosis not present

## 2016-08-20 DIAGNOSIS — C439 Malignant melanoma of skin, unspecified: Secondary | ICD-10-CM | POA: Diagnosis not present

## 2016-08-20 DIAGNOSIS — R911 Solitary pulmonary nodule: Secondary | ICD-10-CM | POA: Diagnosis not present

## 2016-08-28 DIAGNOSIS — D2222 Melanocytic nevi of left ear and external auricular canal: Secondary | ICD-10-CM | POA: Diagnosis not present

## 2016-08-28 DIAGNOSIS — D2272 Melanocytic nevi of left lower limb, including hip: Secondary | ICD-10-CM | POA: Diagnosis not present

## 2016-08-28 DIAGNOSIS — D225 Melanocytic nevi of trunk: Secondary | ICD-10-CM | POA: Diagnosis not present

## 2016-08-28 DIAGNOSIS — D1801 Hemangioma of skin and subcutaneous tissue: Secondary | ICD-10-CM | POA: Diagnosis not present

## 2016-10-22 DIAGNOSIS — C6931 Malignant neoplasm of right choroid: Secondary | ICD-10-CM | POA: Diagnosis not present

## 2016-11-19 DIAGNOSIS — C799 Secondary malignant neoplasm of unspecified site: Secondary | ICD-10-CM | POA: Diagnosis not present

## 2016-11-19 DIAGNOSIS — C439 Malignant melanoma of skin, unspecified: Secondary | ICD-10-CM | POA: Diagnosis not present

## 2016-11-19 DIAGNOSIS — C6931 Malignant neoplasm of right choroid: Secondary | ICD-10-CM | POA: Diagnosis not present

## 2016-11-19 DIAGNOSIS — R911 Solitary pulmonary nodule: Secondary | ICD-10-CM | POA: Diagnosis not present

## 2016-12-23 DIAGNOSIS — C439 Malignant melanoma of skin, unspecified: Secondary | ICD-10-CM | POA: Diagnosis not present

## 2016-12-23 DIAGNOSIS — R1909 Other intra-abdominal and pelvic swelling, mass and lump: Secondary | ICD-10-CM | POA: Diagnosis not present

## 2016-12-23 DIAGNOSIS — R1900 Intra-abdominal and pelvic swelling, mass and lump, unspecified site: Secondary | ICD-10-CM | POA: Diagnosis not present

## 2016-12-23 DIAGNOSIS — C799 Secondary malignant neoplasm of unspecified site: Secondary | ICD-10-CM | POA: Diagnosis not present

## 2016-12-23 DIAGNOSIS — K668 Other specified disorders of peritoneum: Secondary | ICD-10-CM | POA: Diagnosis not present

## 2017-02-18 DIAGNOSIS — R918 Other nonspecific abnormal finding of lung field: Secondary | ICD-10-CM | POA: Diagnosis not present

## 2017-02-18 DIAGNOSIS — K6389 Other specified diseases of intestine: Secondary | ICD-10-CM | POA: Diagnosis not present

## 2017-02-18 DIAGNOSIS — C799 Secondary malignant neoplasm of unspecified site: Secondary | ICD-10-CM | POA: Diagnosis not present

## 2017-02-18 DIAGNOSIS — C6931 Malignant neoplasm of right choroid: Secondary | ICD-10-CM | POA: Diagnosis not present

## 2017-02-18 DIAGNOSIS — Z006 Encounter for examination for normal comparison and control in clinical research program: Secondary | ICD-10-CM | POA: Diagnosis not present

## 2017-02-18 DIAGNOSIS — R590 Localized enlarged lymph nodes: Secondary | ICD-10-CM | POA: Diagnosis not present

## 2017-02-18 DIAGNOSIS — C439 Malignant melanoma of skin, unspecified: Secondary | ICD-10-CM | POA: Diagnosis not present

## 2017-02-24 DIAGNOSIS — W888XXS Exposure to other ionizing radiation, sequela: Secondary | ICD-10-CM | POA: Diagnosis not present

## 2017-02-24 DIAGNOSIS — H43391 Other vitreous opacities, right eye: Secondary | ICD-10-CM | POA: Diagnosis not present

## 2017-02-24 DIAGNOSIS — H43811 Vitreous degeneration, right eye: Secondary | ICD-10-CM | POA: Diagnosis not present

## 2017-02-24 DIAGNOSIS — C787 Secondary malignant neoplasm of liver and intrahepatic bile duct: Secondary | ICD-10-CM | POA: Diagnosis not present

## 2017-02-24 DIAGNOSIS — C799 Secondary malignant neoplasm of unspecified site: Secondary | ICD-10-CM | POA: Diagnosis not present

## 2017-02-24 DIAGNOSIS — H3521 Other non-diabetic proliferative retinopathy, right eye: Secondary | ICD-10-CM | POA: Diagnosis not present

## 2017-02-24 DIAGNOSIS — C6931 Malignant neoplasm of right choroid: Secondary | ICD-10-CM | POA: Diagnosis not present

## 2017-04-27 DIAGNOSIS — Z006 Encounter for examination for normal comparison and control in clinical research program: Secondary | ICD-10-CM | POA: Diagnosis not present

## 2017-04-27 DIAGNOSIS — C6931 Malignant neoplasm of right choroid: Secondary | ICD-10-CM | POA: Diagnosis not present

## 2017-04-27 DIAGNOSIS — C799 Secondary malignant neoplasm of unspecified site: Secondary | ICD-10-CM | POA: Diagnosis not present

## 2017-04-29 DIAGNOSIS — C787 Secondary malignant neoplasm of liver and intrahepatic bile duct: Secondary | ICD-10-CM | POA: Diagnosis not present

## 2017-04-29 DIAGNOSIS — K6389 Other specified diseases of intestine: Secondary | ICD-10-CM | POA: Diagnosis not present

## 2017-04-29 DIAGNOSIS — C6990 Malignant neoplasm of unspecified site of unspecified eye: Secondary | ICD-10-CM | POA: Diagnosis not present

## 2017-04-29 DIAGNOSIS — K769 Liver disease, unspecified: Secondary | ICD-10-CM | POA: Diagnosis not present

## 2017-04-29 DIAGNOSIS — R918 Other nonspecific abnormal finding of lung field: Secondary | ICD-10-CM | POA: Diagnosis not present

## 2017-04-29 DIAGNOSIS — C6931 Malignant neoplasm of right choroid: Secondary | ICD-10-CM | POA: Diagnosis not present

## 2017-04-29 DIAGNOSIS — Z85840 Personal history of malignant neoplasm of eye: Secondary | ICD-10-CM | POA: Diagnosis not present

## 2017-05-11 DIAGNOSIS — C799 Secondary malignant neoplasm of unspecified site: Secondary | ICD-10-CM | POA: Diagnosis not present

## 2017-05-11 DIAGNOSIS — R238 Other skin changes: Secondary | ICD-10-CM | POA: Diagnosis not present

## 2017-05-11 DIAGNOSIS — T451X5A Adverse effect of antineoplastic and immunosuppressive drugs, initial encounter: Secondary | ICD-10-CM | POA: Diagnosis not present

## 2017-05-11 DIAGNOSIS — Z006 Encounter for examination for normal comparison and control in clinical research program: Secondary | ICD-10-CM | POA: Diagnosis not present

## 2017-05-11 DIAGNOSIS — Z5112 Encounter for antineoplastic immunotherapy: Secondary | ICD-10-CM | POA: Diagnosis not present

## 2017-05-11 DIAGNOSIS — C6931 Malignant neoplasm of right choroid: Secondary | ICD-10-CM | POA: Diagnosis not present

## 2017-05-11 DIAGNOSIS — H5461 Unqualified visual loss, right eye, normal vision left eye: Secondary | ICD-10-CM | POA: Diagnosis not present

## 2017-05-11 DIAGNOSIS — Z5111 Encounter for antineoplastic chemotherapy: Secondary | ICD-10-CM | POA: Diagnosis not present

## 2017-05-11 DIAGNOSIS — L538 Other specified erythematous conditions: Secondary | ICD-10-CM | POA: Diagnosis not present

## 2017-05-11 DIAGNOSIS — L270 Generalized skin eruption due to drugs and medicaments taken internally: Secondary | ICD-10-CM | POA: Diagnosis not present

## 2017-05-11 DIAGNOSIS — K219 Gastro-esophageal reflux disease without esophagitis: Secondary | ICD-10-CM | POA: Diagnosis not present

## 2017-05-12 DIAGNOSIS — Z5112 Encounter for antineoplastic immunotherapy: Secondary | ICD-10-CM | POA: Diagnosis not present

## 2017-05-12 DIAGNOSIS — K219 Gastro-esophageal reflux disease without esophagitis: Secondary | ICD-10-CM | POA: Diagnosis not present

## 2017-05-12 DIAGNOSIS — C6931 Malignant neoplasm of right choroid: Secondary | ICD-10-CM | POA: Diagnosis not present

## 2017-05-12 DIAGNOSIS — L538 Other specified erythematous conditions: Secondary | ICD-10-CM | POA: Diagnosis not present

## 2017-05-12 DIAGNOSIS — Z006 Encounter for examination for normal comparison and control in clinical research program: Secondary | ICD-10-CM | POA: Diagnosis not present

## 2017-05-12 DIAGNOSIS — L270 Generalized skin eruption due to drugs and medicaments taken internally: Secondary | ICD-10-CM | POA: Diagnosis not present

## 2017-05-12 DIAGNOSIS — H5461 Unqualified visual loss, right eye, normal vision left eye: Secondary | ICD-10-CM | POA: Diagnosis not present

## 2017-05-12 DIAGNOSIS — T451X5A Adverse effect of antineoplastic and immunosuppressive drugs, initial encounter: Secondary | ICD-10-CM | POA: Diagnosis not present

## 2017-05-12 DIAGNOSIS — R238 Other skin changes: Secondary | ICD-10-CM | POA: Diagnosis not present

## 2017-05-18 DIAGNOSIS — C6931 Malignant neoplasm of right choroid: Secondary | ICD-10-CM | POA: Diagnosis not present

## 2017-05-18 DIAGNOSIS — R110 Nausea: Secondary | ICD-10-CM | POA: Diagnosis not present

## 2017-05-18 DIAGNOSIS — H5440 Blindness, one eye, unspecified eye: Secondary | ICD-10-CM | POA: Diagnosis not present

## 2017-05-18 DIAGNOSIS — R21 Rash and other nonspecific skin eruption: Secondary | ICD-10-CM | POA: Diagnosis not present

## 2017-05-18 DIAGNOSIS — K219 Gastro-esophageal reflux disease without esophagitis: Secondary | ICD-10-CM | POA: Diagnosis not present

## 2017-05-18 DIAGNOSIS — Z5112 Encounter for antineoplastic immunotherapy: Secondary | ICD-10-CM | POA: Diagnosis not present

## 2017-05-18 DIAGNOSIS — Z006 Encounter for examination for normal comparison and control in clinical research program: Secondary | ICD-10-CM | POA: Diagnosis not present

## 2017-05-18 DIAGNOSIS — I951 Orthostatic hypotension: Secondary | ICD-10-CM | POA: Diagnosis not present

## 2017-05-18 DIAGNOSIS — C787 Secondary malignant neoplasm of liver and intrahepatic bile duct: Secondary | ICD-10-CM | POA: Diagnosis not present

## 2017-05-18 DIAGNOSIS — R609 Edema, unspecified: Secondary | ICD-10-CM | POA: Diagnosis not present

## 2017-05-19 DIAGNOSIS — C6931 Malignant neoplasm of right choroid: Secondary | ICD-10-CM | POA: Diagnosis not present

## 2017-05-19 DIAGNOSIS — R21 Rash and other nonspecific skin eruption: Secondary | ICD-10-CM | POA: Diagnosis not present

## 2017-05-19 DIAGNOSIS — Z5112 Encounter for antineoplastic immunotherapy: Secondary | ICD-10-CM | POA: Diagnosis not present

## 2017-05-19 DIAGNOSIS — R609 Edema, unspecified: Secondary | ICD-10-CM | POA: Diagnosis not present

## 2017-05-19 DIAGNOSIS — R110 Nausea: Secondary | ICD-10-CM | POA: Diagnosis not present

## 2017-05-19 DIAGNOSIS — I951 Orthostatic hypotension: Secondary | ICD-10-CM | POA: Diagnosis not present

## 2017-05-19 DIAGNOSIS — Z006 Encounter for examination for normal comparison and control in clinical research program: Secondary | ICD-10-CM | POA: Diagnosis not present

## 2017-05-19 DIAGNOSIS — C787 Secondary malignant neoplasm of liver and intrahepatic bile duct: Secondary | ICD-10-CM | POA: Diagnosis not present

## 2017-05-19 DIAGNOSIS — K219 Gastro-esophageal reflux disease without esophagitis: Secondary | ICD-10-CM | POA: Diagnosis not present

## 2017-05-19 DIAGNOSIS — H5440 Blindness, one eye, unspecified eye: Secondary | ICD-10-CM | POA: Diagnosis not present

## 2017-05-26 DIAGNOSIS — T451X5A Adverse effect of antineoplastic and immunosuppressive drugs, initial encounter: Secondary | ICD-10-CM | POA: Diagnosis not present

## 2017-05-26 DIAGNOSIS — H5440 Blindness, one eye, unspecified eye: Secondary | ICD-10-CM | POA: Diagnosis not present

## 2017-05-26 DIAGNOSIS — C787 Secondary malignant neoplasm of liver and intrahepatic bile duct: Secondary | ICD-10-CM | POA: Diagnosis not present

## 2017-05-26 DIAGNOSIS — Z006 Encounter for examination for normal comparison and control in clinical research program: Secondary | ICD-10-CM | POA: Diagnosis not present

## 2017-05-26 DIAGNOSIS — L270 Generalized skin eruption due to drugs and medicaments taken internally: Secondary | ICD-10-CM | POA: Diagnosis not present

## 2017-05-26 DIAGNOSIS — Z85840 Personal history of malignant neoplasm of eye: Secondary | ICD-10-CM | POA: Diagnosis not present

## 2017-05-26 DIAGNOSIS — C6931 Malignant neoplasm of right choroid: Secondary | ICD-10-CM | POA: Diagnosis not present

## 2017-05-26 DIAGNOSIS — I952 Hypotension due to drugs: Secondary | ICD-10-CM | POA: Diagnosis not present

## 2017-05-26 DIAGNOSIS — K219 Gastro-esophageal reflux disease without esophagitis: Secondary | ICD-10-CM | POA: Diagnosis not present

## 2017-05-26 DIAGNOSIS — C799 Secondary malignant neoplasm of unspecified site: Secondary | ICD-10-CM | POA: Diagnosis not present

## 2017-05-26 DIAGNOSIS — Z85820 Personal history of malignant melanoma of skin: Secondary | ICD-10-CM | POA: Diagnosis not present

## 2017-05-26 DIAGNOSIS — Z5112 Encounter for antineoplastic immunotherapy: Secondary | ICD-10-CM | POA: Diagnosis not present

## 2017-05-26 DIAGNOSIS — Z79899 Other long term (current) drug therapy: Secondary | ICD-10-CM | POA: Diagnosis not present

## 2017-05-26 DIAGNOSIS — Z5111 Encounter for antineoplastic chemotherapy: Secondary | ICD-10-CM | POA: Diagnosis not present

## 2017-05-27 DIAGNOSIS — H5440 Blindness, one eye, unspecified eye: Secondary | ICD-10-CM | POA: Diagnosis not present

## 2017-05-27 DIAGNOSIS — C799 Secondary malignant neoplasm of unspecified site: Secondary | ICD-10-CM | POA: Diagnosis not present

## 2017-05-27 DIAGNOSIS — C787 Secondary malignant neoplasm of liver and intrahepatic bile duct: Secondary | ICD-10-CM | POA: Diagnosis not present

## 2017-05-27 DIAGNOSIS — I952 Hypotension due to drugs: Secondary | ICD-10-CM | POA: Diagnosis not present

## 2017-05-27 DIAGNOSIS — K219 Gastro-esophageal reflux disease without esophagitis: Secondary | ICD-10-CM | POA: Diagnosis not present

## 2017-05-27 DIAGNOSIS — T451X5A Adverse effect of antineoplastic and immunosuppressive drugs, initial encounter: Secondary | ICD-10-CM | POA: Diagnosis not present

## 2017-05-27 DIAGNOSIS — Z5112 Encounter for antineoplastic immunotherapy: Secondary | ICD-10-CM | POA: Diagnosis not present

## 2017-05-27 DIAGNOSIS — Z5111 Encounter for antineoplastic chemotherapy: Secondary | ICD-10-CM | POA: Diagnosis not present

## 2017-05-27 DIAGNOSIS — Z79899 Other long term (current) drug therapy: Secondary | ICD-10-CM | POA: Diagnosis not present

## 2017-05-27 DIAGNOSIS — L270 Generalized skin eruption due to drugs and medicaments taken internally: Secondary | ICD-10-CM | POA: Diagnosis not present

## 2017-05-27 DIAGNOSIS — Z006 Encounter for examination for normal comparison and control in clinical research program: Secondary | ICD-10-CM | POA: Diagnosis not present

## 2017-05-27 DIAGNOSIS — Z85820 Personal history of malignant melanoma of skin: Secondary | ICD-10-CM | POA: Diagnosis not present

## 2017-05-27 DIAGNOSIS — Z85840 Personal history of malignant neoplasm of eye: Secondary | ICD-10-CM | POA: Diagnosis not present

## 2017-06-03 DIAGNOSIS — C6931 Malignant neoplasm of right choroid: Secondary | ICD-10-CM | POA: Diagnosis not present

## 2017-06-03 DIAGNOSIS — R748 Abnormal levels of other serum enzymes: Secondary | ICD-10-CM | POA: Diagnosis not present

## 2017-06-03 DIAGNOSIS — C439 Malignant melanoma of skin, unspecified: Secondary | ICD-10-CM | POA: Diagnosis not present

## 2017-06-03 DIAGNOSIS — Z79899 Other long term (current) drug therapy: Secondary | ICD-10-CM | POA: Diagnosis not present

## 2017-06-03 DIAGNOSIS — C787 Secondary malignant neoplasm of liver and intrahepatic bile duct: Secondary | ICD-10-CM | POA: Diagnosis not present

## 2017-06-03 DIAGNOSIS — I959 Hypotension, unspecified: Secondary | ICD-10-CM | POA: Diagnosis not present

## 2017-06-03 DIAGNOSIS — Z006 Encounter for examination for normal comparison and control in clinical research program: Secondary | ICD-10-CM | POA: Diagnosis not present

## 2017-06-03 DIAGNOSIS — R600 Localized edema: Secondary | ICD-10-CM | POA: Diagnosis not present

## 2017-06-03 DIAGNOSIS — L270 Generalized skin eruption due to drugs and medicaments taken internally: Secondary | ICD-10-CM | POA: Diagnosis not present

## 2017-06-03 DIAGNOSIS — R21 Rash and other nonspecific skin eruption: Secondary | ICD-10-CM | POA: Diagnosis not present

## 2017-06-04 DIAGNOSIS — Z006 Encounter for examination for normal comparison and control in clinical research program: Secondary | ICD-10-CM | POA: Diagnosis not present

## 2017-06-04 DIAGNOSIS — I959 Hypotension, unspecified: Secondary | ICD-10-CM | POA: Diagnosis not present

## 2017-06-04 DIAGNOSIS — R748 Abnormal levels of other serum enzymes: Secondary | ICD-10-CM | POA: Diagnosis not present

## 2017-06-04 DIAGNOSIS — R600 Localized edema: Secondary | ICD-10-CM | POA: Diagnosis not present

## 2017-06-04 DIAGNOSIS — C6931 Malignant neoplasm of right choroid: Secondary | ICD-10-CM | POA: Diagnosis not present

## 2017-06-04 DIAGNOSIS — Z79899 Other long term (current) drug therapy: Secondary | ICD-10-CM | POA: Diagnosis not present

## 2017-06-04 DIAGNOSIS — C787 Secondary malignant neoplasm of liver and intrahepatic bile duct: Secondary | ICD-10-CM | POA: Diagnosis not present

## 2017-06-04 DIAGNOSIS — C439 Malignant melanoma of skin, unspecified: Secondary | ICD-10-CM | POA: Diagnosis not present

## 2017-06-04 DIAGNOSIS — R21 Rash and other nonspecific skin eruption: Secondary | ICD-10-CM | POA: Diagnosis not present

## 2017-06-10 DIAGNOSIS — C6931 Malignant neoplasm of right choroid: Secondary | ICD-10-CM | POA: Diagnosis not present

## 2017-06-10 DIAGNOSIS — C787 Secondary malignant neoplasm of liver and intrahepatic bile duct: Secondary | ICD-10-CM | POA: Diagnosis not present

## 2017-06-10 DIAGNOSIS — Z5111 Encounter for antineoplastic chemotherapy: Secondary | ICD-10-CM | POA: Diagnosis not present

## 2017-06-10 DIAGNOSIS — Z8042 Family history of malignant neoplasm of prostate: Secondary | ICD-10-CM | POA: Diagnosis not present

## 2017-06-10 DIAGNOSIS — Z9049 Acquired absence of other specified parts of digestive tract: Secondary | ICD-10-CM | POA: Diagnosis not present

## 2017-06-10 DIAGNOSIS — C799 Secondary malignant neoplasm of unspecified site: Secondary | ICD-10-CM | POA: Diagnosis not present

## 2017-06-10 DIAGNOSIS — R21 Rash and other nonspecific skin eruption: Secondary | ICD-10-CM | POA: Diagnosis not present

## 2017-06-10 DIAGNOSIS — Z006 Encounter for examination for normal comparison and control in clinical research program: Secondary | ICD-10-CM | POA: Diagnosis not present

## 2017-06-15 DIAGNOSIS — Z006 Encounter for examination for normal comparison and control in clinical research program: Secondary | ICD-10-CM | POA: Diagnosis not present

## 2017-06-15 DIAGNOSIS — R21 Rash and other nonspecific skin eruption: Secondary | ICD-10-CM | POA: Diagnosis not present

## 2017-06-15 DIAGNOSIS — Z5111 Encounter for antineoplastic chemotherapy: Secondary | ICD-10-CM | POA: Diagnosis not present

## 2017-06-15 DIAGNOSIS — C799 Secondary malignant neoplasm of unspecified site: Secondary | ICD-10-CM | POA: Diagnosis not present

## 2017-06-15 DIAGNOSIS — L988 Other specified disorders of the skin and subcutaneous tissue: Secondary | ICD-10-CM | POA: Diagnosis not present

## 2017-06-15 DIAGNOSIS — Z9889 Other specified postprocedural states: Secondary | ICD-10-CM | POA: Diagnosis not present

## 2017-06-15 DIAGNOSIS — C6931 Malignant neoplasm of right choroid: Secondary | ICD-10-CM | POA: Diagnosis not present

## 2017-06-24 DIAGNOSIS — Z5112 Encounter for antineoplastic immunotherapy: Secondary | ICD-10-CM | POA: Diagnosis not present

## 2017-06-24 DIAGNOSIS — Z006 Encounter for examination for normal comparison and control in clinical research program: Secondary | ICD-10-CM | POA: Diagnosis not present

## 2017-06-24 DIAGNOSIS — C799 Secondary malignant neoplasm of unspecified site: Secondary | ICD-10-CM | POA: Diagnosis not present

## 2017-06-24 DIAGNOSIS — Z5111 Encounter for antineoplastic chemotherapy: Secondary | ICD-10-CM | POA: Diagnosis not present

## 2017-06-24 DIAGNOSIS — C6931 Malignant neoplasm of right choroid: Secondary | ICD-10-CM | POA: Diagnosis not present

## 2017-07-01 DIAGNOSIS — Z5111 Encounter for antineoplastic chemotherapy: Secondary | ICD-10-CM | POA: Diagnosis not present

## 2017-07-01 DIAGNOSIS — C6931 Malignant neoplasm of right choroid: Secondary | ICD-10-CM | POA: Diagnosis not present

## 2017-07-01 DIAGNOSIS — C787 Secondary malignant neoplasm of liver and intrahepatic bile duct: Secondary | ICD-10-CM | POA: Diagnosis not present

## 2017-07-01 DIAGNOSIS — C799 Secondary malignant neoplasm of unspecified site: Secondary | ICD-10-CM | POA: Diagnosis not present

## 2017-07-01 DIAGNOSIS — Z006 Encounter for examination for normal comparison and control in clinical research program: Secondary | ICD-10-CM | POA: Diagnosis not present

## 2017-07-01 DIAGNOSIS — I959 Hypotension, unspecified: Secondary | ICD-10-CM | POA: Diagnosis not present

## 2017-07-01 DIAGNOSIS — R21 Rash and other nonspecific skin eruption: Secondary | ICD-10-CM | POA: Diagnosis not present

## 2017-07-01 DIAGNOSIS — L818 Other specified disorders of pigmentation: Secondary | ICD-10-CM | POA: Diagnosis not present

## 2017-07-01 DIAGNOSIS — L299 Pruritus, unspecified: Secondary | ICD-10-CM | POA: Diagnosis not present

## 2017-07-02 DIAGNOSIS — Z0000 Encounter for general adult medical examination without abnormal findings: Secondary | ICD-10-CM | POA: Diagnosis not present

## 2017-07-02 DIAGNOSIS — R8290 Unspecified abnormal findings in urine: Secondary | ICD-10-CM | POA: Diagnosis not present

## 2017-07-08 DIAGNOSIS — C787 Secondary malignant neoplasm of liver and intrahepatic bile duct: Secondary | ICD-10-CM | POA: Diagnosis not present

## 2017-07-08 DIAGNOSIS — R21 Rash and other nonspecific skin eruption: Secondary | ICD-10-CM | POA: Diagnosis not present

## 2017-07-08 DIAGNOSIS — Z5181 Encounter for therapeutic drug level monitoring: Secondary | ICD-10-CM | POA: Diagnosis not present

## 2017-07-08 DIAGNOSIS — C799 Secondary malignant neoplasm of unspecified site: Secondary | ICD-10-CM | POA: Diagnosis not present

## 2017-07-08 DIAGNOSIS — C6931 Malignant neoplasm of right choroid: Secondary | ICD-10-CM | POA: Diagnosis not present

## 2017-07-08 DIAGNOSIS — T451X5D Adverse effect of antineoplastic and immunosuppressive drugs, subsequent encounter: Secondary | ICD-10-CM | POA: Diagnosis not present

## 2017-07-08 DIAGNOSIS — Z006 Encounter for examination for normal comparison and control in clinical research program: Secondary | ICD-10-CM | POA: Diagnosis not present

## 2017-07-08 DIAGNOSIS — L988 Other specified disorders of the skin and subcutaneous tissue: Secondary | ICD-10-CM | POA: Diagnosis not present

## 2017-07-08 DIAGNOSIS — Z5111 Encounter for antineoplastic chemotherapy: Secondary | ICD-10-CM | POA: Diagnosis not present

## 2017-07-08 DIAGNOSIS — Z79899 Other long term (current) drug therapy: Secondary | ICD-10-CM | POA: Diagnosis not present

## 2017-07-15 DIAGNOSIS — L80 Vitiligo: Secondary | ICD-10-CM | POA: Diagnosis not present

## 2017-07-15 DIAGNOSIS — C799 Secondary malignant neoplasm of unspecified site: Secondary | ICD-10-CM | POA: Diagnosis not present

## 2017-07-15 DIAGNOSIS — R5383 Other fatigue: Secondary | ICD-10-CM | POA: Diagnosis not present

## 2017-07-15 DIAGNOSIS — Z5112 Encounter for antineoplastic immunotherapy: Secondary | ICD-10-CM | POA: Diagnosis not present

## 2017-07-15 DIAGNOSIS — Z006 Encounter for examination for normal comparison and control in clinical research program: Secondary | ICD-10-CM | POA: Diagnosis not present

## 2017-07-15 DIAGNOSIS — C6931 Malignant neoplasm of right choroid: Secondary | ICD-10-CM | POA: Diagnosis not present

## 2017-07-15 DIAGNOSIS — Z5111 Encounter for antineoplastic chemotherapy: Secondary | ICD-10-CM | POA: Diagnosis not present

## 2017-07-22 DIAGNOSIS — L819 Disorder of pigmentation, unspecified: Secondary | ICD-10-CM | POA: Diagnosis not present

## 2017-07-22 DIAGNOSIS — C799 Secondary malignant neoplasm of unspecified site: Secondary | ICD-10-CM | POA: Diagnosis not present

## 2017-07-22 DIAGNOSIS — R21 Rash and other nonspecific skin eruption: Secondary | ICD-10-CM | POA: Diagnosis not present

## 2017-07-22 DIAGNOSIS — Z006 Encounter for examination for normal comparison and control in clinical research program: Secondary | ICD-10-CM | POA: Diagnosis not present

## 2017-07-22 DIAGNOSIS — Z5111 Encounter for antineoplastic chemotherapy: Secondary | ICD-10-CM | POA: Diagnosis not present

## 2017-07-22 DIAGNOSIS — R5383 Other fatigue: Secondary | ICD-10-CM | POA: Diagnosis not present

## 2017-07-22 DIAGNOSIS — L80 Vitiligo: Secondary | ICD-10-CM | POA: Diagnosis not present

## 2017-07-22 DIAGNOSIS — L299 Pruritus, unspecified: Secondary | ICD-10-CM | POA: Diagnosis not present

## 2017-07-22 DIAGNOSIS — C787 Secondary malignant neoplasm of liver and intrahepatic bile duct: Secondary | ICD-10-CM | POA: Diagnosis not present

## 2017-07-22 DIAGNOSIS — C6931 Malignant neoplasm of right choroid: Secondary | ICD-10-CM | POA: Diagnosis not present

## 2017-07-29 DIAGNOSIS — C799 Secondary malignant neoplasm of unspecified site: Secondary | ICD-10-CM | POA: Diagnosis not present

## 2017-07-29 DIAGNOSIS — R21 Rash and other nonspecific skin eruption: Secondary | ICD-10-CM | POA: Diagnosis not present

## 2017-07-29 DIAGNOSIS — Z006 Encounter for examination for normal comparison and control in clinical research program: Secondary | ICD-10-CM | POA: Diagnosis not present

## 2017-07-29 DIAGNOSIS — C787 Secondary malignant neoplasm of liver and intrahepatic bile duct: Secondary | ICD-10-CM | POA: Diagnosis not present

## 2017-07-29 DIAGNOSIS — C6931 Malignant neoplasm of right choroid: Secondary | ICD-10-CM | POA: Diagnosis not present

## 2017-07-29 DIAGNOSIS — L299 Pruritus, unspecified: Secondary | ICD-10-CM | POA: Diagnosis not present

## 2017-07-29 DIAGNOSIS — R5383 Other fatigue: Secondary | ICD-10-CM | POA: Diagnosis not present

## 2017-07-29 DIAGNOSIS — L80 Vitiligo: Secondary | ICD-10-CM | POA: Diagnosis not present

## 2017-08-05 DIAGNOSIS — C786 Secondary malignant neoplasm of retroperitoneum and peritoneum: Secondary | ICD-10-CM | POA: Diagnosis not present

## 2017-08-05 DIAGNOSIS — T451X5D Adverse effect of antineoplastic and immunosuppressive drugs, subsequent encounter: Secondary | ICD-10-CM | POA: Diagnosis not present

## 2017-08-05 DIAGNOSIS — Z006 Encounter for examination for normal comparison and control in clinical research program: Secondary | ICD-10-CM | POA: Diagnosis not present

## 2017-08-05 DIAGNOSIS — C6931 Malignant neoplasm of right choroid: Secondary | ICD-10-CM | POA: Diagnosis not present

## 2017-08-05 DIAGNOSIS — E278 Other specified disorders of adrenal gland: Secondary | ICD-10-CM | POA: Diagnosis not present

## 2017-08-05 DIAGNOSIS — C787 Secondary malignant neoplasm of liver and intrahepatic bile duct: Secondary | ICD-10-CM | POA: Diagnosis not present

## 2017-08-05 DIAGNOSIS — L818 Other specified disorders of pigmentation: Secondary | ICD-10-CM | POA: Diagnosis not present

## 2017-08-05 DIAGNOSIS — R5383 Other fatigue: Secondary | ICD-10-CM | POA: Diagnosis not present

## 2017-08-05 DIAGNOSIS — R911 Solitary pulmonary nodule: Secondary | ICD-10-CM | POA: Diagnosis not present

## 2017-08-05 DIAGNOSIS — R21 Rash and other nonspecific skin eruption: Secondary | ICD-10-CM | POA: Diagnosis not present

## 2017-08-05 DIAGNOSIS — C799 Secondary malignant neoplasm of unspecified site: Secondary | ICD-10-CM | POA: Diagnosis not present

## 2017-08-05 DIAGNOSIS — C439 Malignant melanoma of skin, unspecified: Secondary | ICD-10-CM | POA: Diagnosis not present

## 2017-08-05 DIAGNOSIS — L299 Pruritus, unspecified: Secondary | ICD-10-CM | POA: Diagnosis not present

## 2017-09-01 DIAGNOSIS — T66XXXS Radiation sickness, unspecified, sequela: Secondary | ICD-10-CM | POA: Diagnosis not present

## 2017-09-01 DIAGNOSIS — C799 Secondary malignant neoplasm of unspecified site: Secondary | ICD-10-CM | POA: Diagnosis not present

## 2017-09-01 DIAGNOSIS — C6931 Malignant neoplasm of right choroid: Secondary | ICD-10-CM | POA: Diagnosis not present

## 2017-09-01 DIAGNOSIS — H3589 Other specified retinal disorders: Secondary | ICD-10-CM | POA: Diagnosis not present

## 2017-09-02 DIAGNOSIS — L819 Disorder of pigmentation, unspecified: Secondary | ICD-10-CM | POA: Diagnosis not present

## 2017-09-02 DIAGNOSIS — Z9889 Other specified postprocedural states: Secondary | ICD-10-CM | POA: Diagnosis not present

## 2017-09-02 DIAGNOSIS — Z006 Encounter for examination for normal comparison and control in clinical research program: Secondary | ICD-10-CM | POA: Diagnosis not present

## 2017-09-02 DIAGNOSIS — C799 Secondary malignant neoplasm of unspecified site: Secondary | ICD-10-CM | POA: Diagnosis not present

## 2017-09-02 DIAGNOSIS — C787 Secondary malignant neoplasm of liver and intrahepatic bile duct: Secondary | ICD-10-CM | POA: Diagnosis not present

## 2017-09-02 DIAGNOSIS — Z5112 Encounter for antineoplastic immunotherapy: Secondary | ICD-10-CM | POA: Diagnosis not present

## 2017-09-02 DIAGNOSIS — C6931 Malignant neoplasm of right choroid: Secondary | ICD-10-CM | POA: Diagnosis not present

## 2017-09-02 DIAGNOSIS — Z5111 Encounter for antineoplastic chemotherapy: Secondary | ICD-10-CM | POA: Diagnosis not present

## 2017-09-21 DIAGNOSIS — H16102 Unspecified superficial keratitis, left eye: Secondary | ICD-10-CM | POA: Diagnosis not present

## 2017-09-21 DIAGNOSIS — H04123 Dry eye syndrome of bilateral lacrimal glands: Secondary | ICD-10-CM | POA: Diagnosis not present

## 2017-09-23 DIAGNOSIS — Z5112 Encounter for antineoplastic immunotherapy: Secondary | ICD-10-CM | POA: Diagnosis not present

## 2017-09-23 DIAGNOSIS — R509 Fever, unspecified: Secondary | ICD-10-CM | POA: Diagnosis not present

## 2017-09-23 DIAGNOSIS — C787 Secondary malignant neoplasm of liver and intrahepatic bile duct: Secondary | ICD-10-CM | POA: Diagnosis not present

## 2017-09-23 DIAGNOSIS — C786 Secondary malignant neoplasm of retroperitoneum and peritoneum: Secondary | ICD-10-CM | POA: Diagnosis not present

## 2017-09-23 DIAGNOSIS — C6931 Malignant neoplasm of right choroid: Secondary | ICD-10-CM | POA: Diagnosis not present

## 2017-09-23 DIAGNOSIS — T451X5A Adverse effect of antineoplastic and immunosuppressive drugs, initial encounter: Secondary | ICD-10-CM | POA: Diagnosis not present

## 2017-09-23 DIAGNOSIS — R197 Diarrhea, unspecified: Secondary | ICD-10-CM | POA: Diagnosis not present

## 2017-09-23 DIAGNOSIS — Z5111 Encounter for antineoplastic chemotherapy: Secondary | ICD-10-CM | POA: Diagnosis not present

## 2017-09-23 DIAGNOSIS — L818 Other specified disorders of pigmentation: Secondary | ICD-10-CM | POA: Diagnosis not present

## 2017-09-23 DIAGNOSIS — Z9049 Acquired absence of other specified parts of digestive tract: Secondary | ICD-10-CM | POA: Diagnosis not present

## 2017-09-23 DIAGNOSIS — M2550 Pain in unspecified joint: Secondary | ICD-10-CM | POA: Diagnosis not present

## 2017-09-23 DIAGNOSIS — Z923 Personal history of irradiation: Secondary | ICD-10-CM | POA: Diagnosis not present

## 2017-09-23 DIAGNOSIS — L299 Pruritus, unspecified: Secondary | ICD-10-CM | POA: Diagnosis not present

## 2017-09-23 DIAGNOSIS — C799 Secondary malignant neoplasm of unspecified site: Secondary | ICD-10-CM | POA: Diagnosis not present

## 2017-09-23 DIAGNOSIS — R5383 Other fatigue: Secondary | ICD-10-CM | POA: Diagnosis not present

## 2017-09-23 DIAGNOSIS — R21 Rash and other nonspecific skin eruption: Secondary | ICD-10-CM | POA: Diagnosis not present

## 2017-10-14 DIAGNOSIS — L818 Other specified disorders of pigmentation: Secondary | ICD-10-CM | POA: Diagnosis not present

## 2017-10-14 DIAGNOSIS — T451X5A Adverse effect of antineoplastic and immunosuppressive drugs, initial encounter: Secondary | ICD-10-CM | POA: Diagnosis not present

## 2017-10-14 DIAGNOSIS — C799 Secondary malignant neoplasm of unspecified site: Secondary | ICD-10-CM | POA: Diagnosis not present

## 2017-10-14 DIAGNOSIS — Z5111 Encounter for antineoplastic chemotherapy: Secondary | ICD-10-CM | POA: Diagnosis not present

## 2017-10-14 DIAGNOSIS — C786 Secondary malignant neoplasm of retroperitoneum and peritoneum: Secondary | ICD-10-CM | POA: Diagnosis not present

## 2017-10-14 DIAGNOSIS — C6931 Malignant neoplasm of right choroid: Secondary | ICD-10-CM | POA: Diagnosis not present

## 2017-10-14 DIAGNOSIS — L299 Pruritus, unspecified: Secondary | ICD-10-CM | POA: Diagnosis not present

## 2017-10-14 DIAGNOSIS — L80 Vitiligo: Secondary | ICD-10-CM | POA: Diagnosis not present

## 2017-10-14 DIAGNOSIS — Z5112 Encounter for antineoplastic immunotherapy: Secondary | ICD-10-CM | POA: Diagnosis not present

## 2017-10-14 DIAGNOSIS — Z9889 Other specified postprocedural states: Secondary | ICD-10-CM | POA: Diagnosis not present

## 2017-10-14 DIAGNOSIS — R5383 Other fatigue: Secondary | ICD-10-CM | POA: Diagnosis not present

## 2017-10-14 DIAGNOSIS — C787 Secondary malignant neoplasm of liver and intrahepatic bile duct: Secondary | ICD-10-CM | POA: Diagnosis not present

## 2017-10-14 DIAGNOSIS — Z9049 Acquired absence of other specified parts of digestive tract: Secondary | ICD-10-CM | POA: Diagnosis not present

## 2017-10-14 DIAGNOSIS — L659 Nonscarring hair loss, unspecified: Secondary | ICD-10-CM | POA: Diagnosis not present

## 2017-10-14 DIAGNOSIS — R0789 Other chest pain: Secondary | ICD-10-CM | POA: Diagnosis not present

## 2017-11-05 DIAGNOSIS — R221 Localized swelling, mass and lump, neck: Secondary | ICD-10-CM | POA: Diagnosis not present

## 2017-11-05 DIAGNOSIS — C799 Secondary malignant neoplasm of unspecified site: Secondary | ICD-10-CM | POA: Diagnosis not present

## 2017-11-05 DIAGNOSIS — R740 Nonspecific elevation of levels of transaminase and lactic acid dehydrogenase [LDH]: Secondary | ICD-10-CM | POA: Diagnosis not present

## 2017-11-05 DIAGNOSIS — C6931 Malignant neoplasm of right choroid: Secondary | ICD-10-CM | POA: Diagnosis not present

## 2017-11-10 DIAGNOSIS — C799 Secondary malignant neoplasm of unspecified site: Secondary | ICD-10-CM | POA: Diagnosis not present

## 2017-11-10 DIAGNOSIS — R740 Nonspecific elevation of levels of transaminase and lactic acid dehydrogenase [LDH]: Secondary | ICD-10-CM | POA: Diagnosis not present

## 2017-11-13 DIAGNOSIS — C799 Secondary malignant neoplasm of unspecified site: Secondary | ICD-10-CM | POA: Diagnosis not present

## 2017-11-16 DIAGNOSIS — R740 Nonspecific elevation of levels of transaminase and lactic acid dehydrogenase [LDH]: Secondary | ICD-10-CM | POA: Diagnosis not present

## 2017-11-16 DIAGNOSIS — C799 Secondary malignant neoplasm of unspecified site: Secondary | ICD-10-CM | POA: Diagnosis not present

## 2017-11-18 DIAGNOSIS — C787 Secondary malignant neoplasm of liver and intrahepatic bile duct: Secondary | ICD-10-CM | POA: Diagnosis not present

## 2017-11-18 DIAGNOSIS — R918 Other nonspecific abnormal finding of lung field: Secondary | ICD-10-CM | POA: Diagnosis not present

## 2017-11-18 DIAGNOSIS — C6931 Malignant neoplasm of right choroid: Secondary | ICD-10-CM | POA: Diagnosis not present

## 2017-11-18 DIAGNOSIS — C786 Secondary malignant neoplasm of retroperitoneum and peritoneum: Secondary | ICD-10-CM | POA: Diagnosis not present

## 2017-11-18 DIAGNOSIS — C799 Secondary malignant neoplasm of unspecified site: Secondary | ICD-10-CM | POA: Diagnosis not present

## 2017-11-18 DIAGNOSIS — R221 Localized swelling, mass and lump, neck: Secondary | ICD-10-CM | POA: Diagnosis not present

## 2017-11-18 DIAGNOSIS — C7971 Secondary malignant neoplasm of right adrenal gland: Secondary | ICD-10-CM | POA: Diagnosis not present

## 2017-11-19 DIAGNOSIS — C799 Secondary malignant neoplasm of unspecified site: Secondary | ICD-10-CM | POA: Diagnosis not present

## 2017-11-19 DIAGNOSIS — R740 Nonspecific elevation of levels of transaminase and lactic acid dehydrogenase [LDH]: Secondary | ICD-10-CM | POA: Diagnosis not present

## 2017-11-25 DIAGNOSIS — C799 Secondary malignant neoplasm of unspecified site: Secondary | ICD-10-CM | POA: Diagnosis not present

## 2017-11-30 DIAGNOSIS — C799 Secondary malignant neoplasm of unspecified site: Secondary | ICD-10-CM | POA: Diagnosis not present

## 2017-12-07 DIAGNOSIS — C787 Secondary malignant neoplasm of liver and intrahepatic bile duct: Secondary | ICD-10-CM | POA: Diagnosis not present

## 2017-12-07 DIAGNOSIS — C6931 Malignant neoplasm of right choroid: Secondary | ICD-10-CM | POA: Diagnosis not present

## 2017-12-07 DIAGNOSIS — C799 Secondary malignant neoplasm of unspecified site: Secondary | ICD-10-CM | POA: Diagnosis not present

## 2017-12-14 DIAGNOSIS — C799 Secondary malignant neoplasm of unspecified site: Secondary | ICD-10-CM | POA: Diagnosis not present

## 2017-12-21 DIAGNOSIS — C799 Secondary malignant neoplasm of unspecified site: Secondary | ICD-10-CM | POA: Diagnosis not present

## 2017-12-28 DIAGNOSIS — C799 Secondary malignant neoplasm of unspecified site: Secondary | ICD-10-CM | POA: Diagnosis not present

## 2018-01-01 DIAGNOSIS — R740 Nonspecific elevation of levels of transaminase and lactic acid dehydrogenase [LDH]: Secondary | ICD-10-CM | POA: Diagnosis not present

## 2018-01-05 DIAGNOSIS — R5382 Chronic fatigue, unspecified: Secondary | ICD-10-CM | POA: Diagnosis not present

## 2018-01-07 DIAGNOSIS — C799 Secondary malignant neoplasm of unspecified site: Secondary | ICD-10-CM | POA: Diagnosis not present

## 2018-01-07 DIAGNOSIS — R740 Nonspecific elevation of levels of transaminase and lactic acid dehydrogenase [LDH]: Secondary | ICD-10-CM | POA: Diagnosis not present

## 2018-01-11 DIAGNOSIS — R740 Nonspecific elevation of levels of transaminase and lactic acid dehydrogenase [LDH]: Secondary | ICD-10-CM | POA: Diagnosis not present

## 2018-01-11 DIAGNOSIS — C799 Secondary malignant neoplasm of unspecified site: Secondary | ICD-10-CM | POA: Diagnosis not present

## 2018-01-18 DIAGNOSIS — C799 Secondary malignant neoplasm of unspecified site: Secondary | ICD-10-CM | POA: Diagnosis not present

## 2018-01-27 DIAGNOSIS — C799 Secondary malignant neoplasm of unspecified site: Secondary | ICD-10-CM | POA: Diagnosis not present

## 2018-01-27 DIAGNOSIS — C779 Secondary and unspecified malignant neoplasm of lymph node, unspecified: Secondary | ICD-10-CM | POA: Diagnosis not present

## 2018-01-27 DIAGNOSIS — R911 Solitary pulmonary nodule: Secondary | ICD-10-CM | POA: Diagnosis not present

## 2018-01-27 DIAGNOSIS — C439 Malignant melanoma of skin, unspecified: Secondary | ICD-10-CM | POA: Diagnosis not present

## 2018-01-27 DIAGNOSIS — C6931 Malignant neoplasm of right choroid: Secondary | ICD-10-CM | POA: Diagnosis not present

## 2018-01-27 DIAGNOSIS — Z9221 Personal history of antineoplastic chemotherapy: Secondary | ICD-10-CM | POA: Diagnosis not present

## 2018-01-27 DIAGNOSIS — C786 Secondary malignant neoplasm of retroperitoneum and peritoneum: Secondary | ICD-10-CM | POA: Diagnosis not present

## 2018-02-03 DIAGNOSIS — C786 Secondary malignant neoplasm of retroperitoneum and peritoneum: Secondary | ICD-10-CM | POA: Diagnosis not present

## 2018-02-03 DIAGNOSIS — K7689 Other specified diseases of liver: Secondary | ICD-10-CM | POA: Diagnosis not present

## 2018-02-03 DIAGNOSIS — C439 Malignant melanoma of skin, unspecified: Secondary | ICD-10-CM | POA: Diagnosis not present

## 2018-02-03 DIAGNOSIS — C799 Secondary malignant neoplasm of unspecified site: Secondary | ICD-10-CM | POA: Diagnosis not present

## 2018-02-08 DIAGNOSIS — C799 Secondary malignant neoplasm of unspecified site: Secondary | ICD-10-CM | POA: Diagnosis not present

## 2018-02-09 DIAGNOSIS — T66XXXS Radiation sickness, unspecified, sequela: Secondary | ICD-10-CM | POA: Diagnosis not present

## 2018-02-09 DIAGNOSIS — H3589 Other specified retinal disorders: Secondary | ICD-10-CM | POA: Diagnosis not present

## 2018-02-09 DIAGNOSIS — C6931 Malignant neoplasm of right choroid: Secondary | ICD-10-CM | POA: Diagnosis not present

## 2018-02-11 DIAGNOSIS — C799 Secondary malignant neoplasm of unspecified site: Secondary | ICD-10-CM | POA: Diagnosis not present

## 2018-02-11 DIAGNOSIS — C439 Malignant melanoma of skin, unspecified: Secondary | ICD-10-CM | POA: Diagnosis not present

## 2018-02-11 DIAGNOSIS — C786 Secondary malignant neoplasm of retroperitoneum and peritoneum: Secondary | ICD-10-CM | POA: Diagnosis not present

## 2018-02-12 DIAGNOSIS — C6931 Malignant neoplasm of right choroid: Secondary | ICD-10-CM | POA: Diagnosis not present

## 2018-02-12 DIAGNOSIS — C799 Secondary malignant neoplasm of unspecified site: Secondary | ICD-10-CM | POA: Diagnosis not present

## 2018-02-12 DIAGNOSIS — C6940 Malignant neoplasm of unspecified ciliary body: Secondary | ICD-10-CM | POA: Diagnosis not present

## 2018-02-18 DIAGNOSIS — C787 Secondary malignant neoplasm of liver and intrahepatic bile duct: Secondary | ICD-10-CM | POA: Diagnosis not present

## 2018-02-18 DIAGNOSIS — C799 Secondary malignant neoplasm of unspecified site: Secondary | ICD-10-CM | POA: Diagnosis not present

## 2018-02-19 DIAGNOSIS — C787 Secondary malignant neoplasm of liver and intrahepatic bile duct: Secondary | ICD-10-CM | POA: Diagnosis not present

## 2018-02-19 DIAGNOSIS — C6931 Malignant neoplasm of right choroid: Secondary | ICD-10-CM | POA: Diagnosis not present

## 2018-02-19 DIAGNOSIS — C799 Secondary malignant neoplasm of unspecified site: Secondary | ICD-10-CM | POA: Diagnosis not present

## 2018-02-25 DIAGNOSIS — C799 Secondary malignant neoplasm of unspecified site: Secondary | ICD-10-CM | POA: Diagnosis not present

## 2018-03-01 DIAGNOSIS — C799 Secondary malignant neoplasm of unspecified site: Secondary | ICD-10-CM | POA: Diagnosis not present

## 2018-03-02 DIAGNOSIS — C799 Secondary malignant neoplasm of unspecified site: Secondary | ICD-10-CM | POA: Diagnosis not present

## 2018-03-02 DIAGNOSIS — C6931 Malignant neoplasm of right choroid: Secondary | ICD-10-CM | POA: Diagnosis not present

## 2018-03-02 DIAGNOSIS — C787 Secondary malignant neoplasm of liver and intrahepatic bile duct: Secondary | ICD-10-CM | POA: Diagnosis not present

## 2018-03-24 DIAGNOSIS — C787 Secondary malignant neoplasm of liver and intrahepatic bile duct: Secondary | ICD-10-CM | POA: Diagnosis not present

## 2018-03-24 DIAGNOSIS — C799 Secondary malignant neoplasm of unspecified site: Secondary | ICD-10-CM | POA: Diagnosis not present

## 2018-03-24 DIAGNOSIS — C6931 Malignant neoplasm of right choroid: Secondary | ICD-10-CM | POA: Diagnosis not present

## 2018-03-31 DIAGNOSIS — C787 Secondary malignant neoplasm of liver and intrahepatic bile duct: Secondary | ICD-10-CM | POA: Diagnosis not present

## 2018-03-31 DIAGNOSIS — C6931 Malignant neoplasm of right choroid: Secondary | ICD-10-CM | POA: Diagnosis not present

## 2018-03-31 DIAGNOSIS — Z9221 Personal history of antineoplastic chemotherapy: Secondary | ICD-10-CM | POA: Diagnosis not present

## 2018-03-31 DIAGNOSIS — C799 Secondary malignant neoplasm of unspecified site: Secondary | ICD-10-CM | POA: Diagnosis not present

## 2018-04-28 DIAGNOSIS — C6931 Malignant neoplasm of right choroid: Secondary | ICD-10-CM | POA: Diagnosis not present

## 2018-04-28 DIAGNOSIS — C786 Secondary malignant neoplasm of retroperitoneum and peritoneum: Secondary | ICD-10-CM | POA: Diagnosis not present

## 2018-04-28 DIAGNOSIS — Z9221 Personal history of antineoplastic chemotherapy: Secondary | ICD-10-CM | POA: Diagnosis not present

## 2018-04-28 DIAGNOSIS — R911 Solitary pulmonary nodule: Secondary | ICD-10-CM | POA: Diagnosis not present

## 2018-04-28 DIAGNOSIS — Z5112 Encounter for antineoplastic immunotherapy: Secondary | ICD-10-CM | POA: Diagnosis not present

## 2018-04-28 DIAGNOSIS — Z5111 Encounter for antineoplastic chemotherapy: Secondary | ICD-10-CM | POA: Diagnosis not present

## 2018-04-28 DIAGNOSIS — C439 Malignant melanoma of skin, unspecified: Secondary | ICD-10-CM | POA: Diagnosis not present

## 2018-04-28 DIAGNOSIS — C799 Secondary malignant neoplasm of unspecified site: Secondary | ICD-10-CM | POA: Diagnosis not present

## 2018-04-28 DIAGNOSIS — C787 Secondary malignant neoplasm of liver and intrahepatic bile duct: Secondary | ICD-10-CM | POA: Diagnosis not present

## 2018-08-03 DIAGNOSIS — C799 Secondary malignant neoplasm of unspecified site: Secondary | ICD-10-CM | POA: Diagnosis not present

## 2018-08-03 DIAGNOSIS — C6931 Malignant neoplasm of right choroid: Secondary | ICD-10-CM | POA: Diagnosis not present

## 2018-08-16 DIAGNOSIS — D225 Melanocytic nevi of trunk: Secondary | ICD-10-CM | POA: Diagnosis not present

## 2018-08-16 DIAGNOSIS — Z23 Encounter for immunization: Secondary | ICD-10-CM | POA: Diagnosis not present

## 2018-08-16 DIAGNOSIS — D2222 Melanocytic nevi of left ear and external auricular canal: Secondary | ICD-10-CM | POA: Diagnosis not present

## 2018-08-16 DIAGNOSIS — D2272 Melanocytic nevi of left lower limb, including hip: Secondary | ICD-10-CM | POA: Diagnosis not present

## 2018-10-13 DIAGNOSIS — C439 Malignant melanoma of skin, unspecified: Secondary | ICD-10-CM | POA: Diagnosis not present

## 2018-10-13 DIAGNOSIS — C6931 Malignant neoplasm of right choroid: Secondary | ICD-10-CM | POA: Diagnosis not present

## 2018-10-13 DIAGNOSIS — C786 Secondary malignant neoplasm of retroperitoneum and peritoneum: Secondary | ICD-10-CM | POA: Diagnosis not present

## 2018-10-13 DIAGNOSIS — C799 Secondary malignant neoplasm of unspecified site: Secondary | ICD-10-CM | POA: Diagnosis not present

## 2018-10-13 DIAGNOSIS — K769 Liver disease, unspecified: Secondary | ICD-10-CM | POA: Diagnosis not present

## 2018-10-13 DIAGNOSIS — R911 Solitary pulmonary nodule: Secondary | ICD-10-CM | POA: Diagnosis not present

## 2018-10-13 DIAGNOSIS — E279 Disorder of adrenal gland, unspecified: Secondary | ICD-10-CM | POA: Diagnosis not present

## 2018-10-13 DIAGNOSIS — C787 Secondary malignant neoplasm of liver and intrahepatic bile duct: Secondary | ICD-10-CM | POA: Diagnosis not present

## 2018-11-10 DIAGNOSIS — C799 Secondary malignant neoplasm of unspecified site: Secondary | ICD-10-CM | POA: Diagnosis not present

## 2018-11-16 ENCOUNTER — Encounter: Payer: Self-pay | Admitting: General Surgery

## 2018-11-17 ENCOUNTER — Ambulatory Visit (INDEPENDENT_AMBULATORY_CARE_PROVIDER_SITE_OTHER): Payer: BC Managed Care – PPO | Admitting: Gastroenterology

## 2018-11-17 ENCOUNTER — Other Ambulatory Visit: Payer: Self-pay

## 2018-11-17 DIAGNOSIS — Z1211 Encounter for screening for malignant neoplasm of colon: Secondary | ICD-10-CM | POA: Diagnosis not present

## 2018-11-17 MED ORDER — PEG 3350-KCL-NA BICARB-NACL 420 G PO SOLR: 4000.0000 mL | ORAL | 0 refills | Status: DC

## 2018-11-17 NOTE — Progress Notes (Signed)
This service was provided via virtual visit.  AV was attempted however unsuccesfull.  Only audio was used.  The patient was located at work.  I was located in my office.  The patient did consent to this virtual visit and is aware of possible charges through their insurance for this visit.  The patient is a new patient.  My certified medical assistant, Grace Bushy, contributed to this visit by contacting the patient by phone 1 or 2 business days prior to the appointment and also followed up on the recommendations I made after the visit.  Time spent on virtual visit: 22 min   HPI: This is a very pleasant 48 year old man whom I have actually met before, about 5 years ago for some GERD without alarm symptoms.  Chief complaint is routine risk for colon cancer.  He has metastatic ophthalmologic melanoma which was diagnosed originally about 10 years ago.  He has his care at [facility].  He has undergone right hepatectomy for metastatic disease.  Most recent oncology note from October 2019 explains that he has had "an increase in size of hepatic, adrenal and peritoneal carcinomatosis metastatic disease "  He is on a study protocol for the melanoma currently.  This requires him to take an oral medication twice daily.  He normally takes it around 9 AM and 9 PM.  He is concerned about violating study protocols and so we discussed the exact timing of prep, medications for his prep.  He has no bowel troubles (no bleeding, no constipation, no serious diarrhea).  His chronic GERD symptoms for which she required Nexium have completely resolved.  He stopped proton pump inhibitor for the study protocol and instead manages his GERD symptoms with diet modifications and lifestyle modifications.  He has no dysphasia.  Blood work dated May 2020 shows a normal CBC, normal complete metabolic profile except for AST 42 and alk phos 171  ROS: complete GI ROS as described in HPI, all other review negative.  Constitutional:   No unintentional weight loss   Past Medical History:  Diagnosis Date  . Cancer ([place]) 2009   eye - right (below retina)  . GERD (gastroesophageal reflux disease)   . Mass    chest wall    Past Surgical History:  Procedure Laterality Date  . EYE SURGERY  2009   due to cancer...  . EYE SURGERY     09/20/15 PER DR Carlis Abbott PT CAN ONLY BE SCANNED ON 1.5 TESLA  . INGUINAL HERNIA REPAIR Right 1994  . MASS EXCISION  08/29/2011   Procedure: MINOR EXCISION OF MASS;  Surgeon: Adin Hector, MD;  Location: [place];  Service: General;  Laterality: Left;  local excision of left chest wall mass   . MASS EXCISION N/A 04/11/2014   Procedure: MINOR EXCISION OF EPIDERMOID CYSTS FROM BACK X2;  Surgeon: Fanny Skates, MD;  Location: [place];  Service: General;  Laterality: N/A;    Current Outpatient Medications  Medication Sig Dispense Refill  . Cholecalciferol (D3-1000 PO) Take 2,000 mg by mouth.    Marland Kitchen LORazepam (ATIVAN) 1 MG tablet Take 1 tablet (1 mg total) by mouth every 8 (eight) hours. For anxiety 30 tablet 0  . vitamin B-12 (CYANOCOBALAMIN) 500 MCG tablet Take 500 mcg by mouth daily.     No current facility-administered medications for this visit.     Allergies as of 11/17/2018  . (No Known Allergies)    Family History  Problem Relation Age of  Onset  . Diabetes Mother   . Hypertension Father   . Prostate cancer Paternal Grandfather   . Colon cancer Neg Hx   . Liver cancer Neg Hx   . Pancreatic cancer Neg Hx   . Rectal cancer Neg Hx   . Stomach cancer Neg Hx     Social History   Socioeconomic History  . Marital status: Married    Spouse name: Judson Roch  . Number of children: 1  . Years of education: Not on file  . Highest education level: Not on file  Occupational History  . Occupation: Corporate investment banker    Employer: [employer]  . Financial resource strain: Not on file  . Food insecurity:    Worry: Not on file     Inability: Not on file  . Transportation needs:    Medical: Not on file    Non-medical: Not on file  Tobacco Use  . Smoking status: Never Smoker  . Smokeless tobacco: Never Used  Substance and Sexual Activity  . Alcohol use: Yes    Comment: occasional 2 -3 per week  . Drug use: No  . Sexual activity: Not on file  Lifestyle  . Physical activity:    Days per week: Not on file    Minutes per session: Not on file  . Stress: Not on file  Relationships  . Social connections:    Talks on phone: Not on file    Gets together: Not on file    Attends religious service: Not on file    Active member of club or organization: Not on file    Attends meetings of clubs or organizations: Not on file    Relationship status: Not on file  . Intimate partner violence:    Fear of current or ex partner: Not on file    Emotionally abused: Not on file    Physically abused: Not on file    Forced sexual activity: Not on file  Other Topics Concern  . Not on file  Social History Narrative  . Not on file     Physical Exam: Unable to perform because this was a "telemed visit" due to current Covid-19 pandemic  Assessment and plan: 48 y.o. male with routine risk for colon cancer  He is at routine risk for colon cancer but given his study protocol for his metastatic melanoma he had some concerns about prep timing, prep medications.  We discussed all of this and decided that it would be best for him to have a first case in the morning, 7:30 AM start time colonoscopy.  He will make sure that the GoLYTELY prep does not violate any study protocols as well.  I see no reason for any further blood tests or imaging studies prior to the colonoscopy.  Please see the "Patient Instructions" section for addition details about the plan.  Owens Loffler, MD [facility] 11/17/2018, 8:11 AM

## 2018-11-17 NOTE — Patient Instructions (Addendum)
We will arrange for a colonoscopy in the next 2 or 3 weeks for colon cancer screening.  Routine risk.  We will make sure he has a 7:30 AM case.  Thank you for entrusting me with your care and choosing [facility].  Dr Ardis Hughs

## 2018-11-24 ENCOUNTER — Telehealth: Payer: Self-pay | Admitting: *Deleted

## 2018-11-24 NOTE — Telephone Encounter (Signed)

## 2018-11-26 ENCOUNTER — Encounter: Payer: Self-pay | Admitting: Gastroenterology

## 2018-11-26 ENCOUNTER — Other Ambulatory Visit: Payer: Self-pay

## 2018-11-26 ENCOUNTER — Ambulatory Visit (AMBULATORY_SURGERY_CENTER): Payer: BC Managed Care – PPO | Admitting: Gastroenterology

## 2018-11-26 VITALS — BP 115/66 | HR 66 | Temp 97.7°F | Resp 14 | Ht 71.0 in | Wt 166.0 lb

## 2018-11-26 DIAGNOSIS — D125 Benign neoplasm of sigmoid colon: Secondary | ICD-10-CM | POA: Diagnosis not present

## 2018-11-26 DIAGNOSIS — Z1211 Encounter for screening for malignant neoplasm of colon: Secondary | ICD-10-CM

## 2018-11-26 MED ORDER — SODIUM CHLORIDE 0.9 % IV SOLN: 500.0000 mL | Freq: Once | INTRAVENOUS | Status: DC

## 2018-11-26 NOTE — Progress Notes (Signed)
Report to PACU, RN, vss, BBS= Clear.  

## 2018-11-26 NOTE — Patient Instructions (Signed)
YOU HAD AN ENDOSCOPIC PROCEDURE TODAY AT THE [facility]:   Refer to the procedure report that was given to you for any specific questions about what was found during the examination.  If the procedure report does not answer your questions, please call your gastroenterologist to clarify.  If you requested that your care partner not be given the details of your procedure findings, then the procedure report has been included in a sealed envelope for you to review at your convenience later.  YOU SHOULD EXPECT: Some feelings of bloating in the abdomen. Passage of more gas than usual.  Walking can help get rid of the air that was put into your GI tract during the procedure and reduce the bloating. If you had a lower endoscopy (such as a colonoscopy or flexible sigmoidoscopy) you may notice spotting of blood in your stool or on the toilet paper. If you underwent a bowel prep for your procedure, you may not have a normal bowel movement for a few days.  Please Note:  You might notice some irritation and congestion in your nose or some drainage.  This is from the oxygen used during your procedure.  There is no need for concern and it should clear up in a day or so.  SYMPTOMS TO REPORT IMMEDIATELY:   Following lower endoscopy (colonoscopy or flexible sigmoidoscopy):  Excessive amounts of blood in the stool  Significant tenderness or worsening of abdominal pains  Swelling of the abdomen that is new, acute  Fever of 100F or higher  For urgent or emergent issues, a gastroenterologist can be reached at any hour by calling [phone].   DIET:  We do recommend a small meal at first, but then you may proceed to your regular diet.  Drink plenty of fluids but you should avoid alcoholic beverages for 24 hours.  ACTIVITY:  You should plan to take it easy for the rest of today and you should NOT DRIVE or use heavy machinery until tomorrow (because of the sedation medicines used during the test).     FOLLOW UP: Our staff will call the number listed on your records 48-72 hours following your procedure to check on you and address any questions or concerns that you may have regarding the information given to you following your procedure. If we do not reach you, we will leave a message.  We will attempt to reach you two times.  During this call, we will ask if you have developed any symptoms of COVID 19. If you develop any symptoms (ie: fever, flu-like symptoms, shortness of breath, cough etc.) before then, please call [phone].  If you test positive for Covid 19 in the 2 weeks post procedure, please call and report this information to us.    If any biopsies were taken you will be contacted by phone or by letter within the next 1-3 weeks.  Please call us at [phone] if you have not heard about the biopsies in 3 weeks.    SIGNATURES/CONFIDENTIALITY: You and/or your care partner have signed paperwork which will be entered into your electronic medical record.  These signatures attest to the fact that that the information above on your After Visit Summary has been reviewed and is understood.  Full responsibility of the confidentiality of this discharge information lies with you and/or your care-partner. 

## 2018-11-26 NOTE — Progress Notes (Signed)
Called to room to assist during endoscopic procedure.  Patient ID and intended procedure confirmed with present staff. Received instructions for my participation in the procedure from the performing physician.  

## 2018-11-26 NOTE — Op Note (Signed)
[facility] Patient Name: Darius Fox Procedure Date: 11/26/2018 7:33 AM MRN: [id number] Endoscopist: Milus Banister , MD Age: 48 Referring MD:  Date of Birth: 10/03/1970 Gender: Male Account #: [id number] Procedure:                Colonoscopy Indications:              Screening for colorectal malignant neoplasm Medicines:                Monitored Anesthesia Care Procedure:                Pre-Anesthesia Assessment:                           - Prior to the procedure, a History and Physical                            was performed, and patient medications and                            allergies were reviewed. The patient's tolerance of                            previous anesthesia was also reviewed. The risks                            and benefits of the procedure and the sedation                            options and risks were discussed with the patient.                            All questions were answered, and informed consent                            was obtained. Prior Anticoagulants: The patient has                            taken no previous anticoagulant or antiplatelet                            agents. ASA Grade Assessment: II - A patient with                            mild systemic disease. After reviewing the risks                            and benefits, the patient was deemed in                            satisfactory condition to undergo the procedure.                           After obtaining informed consent, the colonoscope  was passed under direct vision. Throughout the                            procedure, the patient's blood pressure, pulse, and                            oxygen saturations were monitored continuously. The                            Colonoscope was introduced through the anus and                            advanced to the the cecum, identified by                            appendiceal orifice and  ileocecal valve. The                            colonoscopy was performed without difficulty. The                            patient tolerated the procedure well. The quality                            of the bowel preparation was good. The ileocecal                            valve, appendiceal orifice, and rectum were                            photographed. Scope In: 7:40:45 AM Scope Out: 7:54:42 AM Scope Withdrawal Time: 0 hours 7 minutes 17 seconds  Total Procedure Duration: 0 hours 13 minutes 57 seconds  Findings:                 A 4 mm polyp was found in the transverse colon. The                            polyp was sessile. The polyp was removed with a                            cold snare. Resection and retrieval were complete.                           The exam was otherwise without abnormality on                            direct and retroflexion views. Complications:            No immediate complications. Estimated blood loss:                            None. Estimated Blood Loss:     Estimated blood loss: none. Impression:               -  One 4 mm polyp in the transverse colon, removed                            with a cold snare. Resected and retrieved.                           - The examination was otherwise normal on direct                            and retroflexion views. Recommendation:           - Patient has a contact number available for                            emergencies. The signs and symptoms of potential                            delayed complications were discussed with the                            patient. Return to normal activities tomorrow.                            Written discharge instructions were provided to the                            patient.                           - Resume previous diet.                           - Continue present medications.                           You will receive a letter within 2-3 weeks with the                             pathology results and my final recommendations.                           If the polyp(s) is proven to be 'pre-cancerous' on                            pathology, you will need repeat colonoscopy in 5-7                            years. If the polyp(s) is NOT 'precancerous' on                            pathology then you should repeat colon cancer                            screening in 10 years with colonoscopy without need  for colon cancer screening by any method prior to                            then (including stool testing). Milus Banister, MD 11/26/2018 7:57:15 AM This report has been signed electronically.

## 2018-11-26 NOTE — Progress Notes (Signed)
Pt's states no medical or surgical changes since previsit or office visit.  Temps by nancy Cambpell LPN and Vitals by Rica Mote CMA.

## 2018-11-30 ENCOUNTER — Telehealth: Payer: Self-pay

## 2018-11-30 ENCOUNTER — Telehealth: Payer: Self-pay | Admitting: *Deleted

## 2018-11-30 NOTE — Telephone Encounter (Signed)
  Follow up Call-  Call back number 11/26/2018  Post procedure Call Back phone  # [phone]  Permission to leave phone message Yes  Some recent data might be hidden     First follow up call attempt.  Reached voicemail with name identified.  Message left to call if any questions or concerns regarding procedure or COVID 19.

## 2018-11-30 NOTE — Telephone Encounter (Signed)
\   Follow up Call-  Call back number 11/26/2018  Post procedure Call Back phone  # [phone]  Permission to leave phone message Yes  Some recent data might be hidden     Patient questions: 2 Do you have a fever, pain , or abdominal swelling? No. Pain Score  0 *  Have you tolerated food without any problems? Yes.    Have you been able to return to your normal activities? Yes.    Do you have any questions about your discharge instructions: Diet   No. Medications  No. Follow up visit  No.  Do you have questions or concerns about your Care? No.  Actions: * If pain score is 4 or above: 1. No action needed, pain <4.  Pt. Denies any symptoms of Covid 19 since being discharge from our facility.Have you developed a fever since your procedure? No  2.   Have you had an respiratory symptoms (SOB or cough) since your procedure? No  3.   Have you tested positive for COVID 19 since your procedure No  4.   Have you had any family members/close contacts diagnosed with the COVID 19 since your procedure?  No   If yes to any of these questions please route to Joylene John, RN and Alphonsa Gin, RN.

## 2018-12-01 ENCOUNTER — Encounter: Payer: Self-pay | Admitting: Gastroenterology

## 2019-01-03 DIAGNOSIS — C799 Secondary malignant neoplasm of unspecified site: Secondary | ICD-10-CM | POA: Diagnosis not present

## 2019-02-16 DIAGNOSIS — H3589 Other specified retinal disorders: Secondary | ICD-10-CM | POA: Diagnosis not present

## 2019-02-16 DIAGNOSIS — C799 Secondary malignant neoplasm of unspecified site: Secondary | ICD-10-CM | POA: Diagnosis not present

## 2019-02-16 DIAGNOSIS — T66XXXS Radiation sickness, unspecified, sequela: Secondary | ICD-10-CM | POA: Diagnosis not present

## 2019-02-16 DIAGNOSIS — C6931 Malignant neoplasm of right choroid: Secondary | ICD-10-CM | POA: Diagnosis not present

## 2019-02-23 DIAGNOSIS — C799 Secondary malignant neoplasm of unspecified site: Secondary | ICD-10-CM | POA: Diagnosis not present

## 2019-04-27 DIAGNOSIS — C787 Secondary malignant neoplasm of liver and intrahepatic bile duct: Secondary | ICD-10-CM | POA: Diagnosis not present

## 2019-04-27 DIAGNOSIS — Z9221 Personal history of antineoplastic chemotherapy: Secondary | ICD-10-CM | POA: Diagnosis not present

## 2019-04-27 DIAGNOSIS — C801 Malignant (primary) neoplasm, unspecified: Secondary | ICD-10-CM | POA: Diagnosis not present

## 2019-04-30 ENCOUNTER — Encounter (HOSPITAL_COMMUNITY): Payer: Self-pay

## 2019-04-30 ENCOUNTER — Other Ambulatory Visit: Payer: Self-pay

## 2019-04-30 ENCOUNTER — Emergency Department (HOSPITAL_COMMUNITY): Payer: BC Managed Care – PPO

## 2019-04-30 ENCOUNTER — Emergency Department (HOSPITAL_COMMUNITY)
Admission: EM | Admit: 2019-04-30 | Discharge: 2019-05-01 | Disposition: A | Discharge status: Other Acute Inpatient Hospital | Payer: BC Managed Care – PPO | Attending: Emergency Medicine | Admitting: Emergency Medicine

## 2019-04-30 DIAGNOSIS — Z85820 Personal history of malignant melanoma of skin: Secondary | ICD-10-CM | POA: Insufficient documentation

## 2019-04-30 DIAGNOSIS — Z03818 Encounter for observation for suspected exposure to other biological agents ruled out: Secondary | ICD-10-CM | POA: Diagnosis not present

## 2019-04-30 DIAGNOSIS — I959 Hypotension, unspecified: Secondary | ICD-10-CM | POA: Diagnosis not present

## 2019-04-30 DIAGNOSIS — Z20828 Contact with and (suspected) exposure to other viral communicable diseases: Secondary | ICD-10-CM | POA: Insufficient documentation

## 2019-04-30 DIAGNOSIS — R000 Tachycardia, unspecified: Secondary | ICD-10-CM | POA: Diagnosis not present

## 2019-04-30 DIAGNOSIS — R0902 Hypoxemia: Secondary | ICD-10-CM | POA: Diagnosis not present

## 2019-04-30 DIAGNOSIS — K3580 Unspecified acute appendicitis: Secondary | ICD-10-CM | POA: Diagnosis not present

## 2019-04-30 DIAGNOSIS — Z79899 Other long term (current) drug therapy: Secondary | ICD-10-CM | POA: Diagnosis not present

## 2019-04-30 DIAGNOSIS — R1010 Upper abdominal pain, unspecified: Secondary | ICD-10-CM | POA: Diagnosis not present

## 2019-04-30 DIAGNOSIS — R188 Other ascites: Secondary | ICD-10-CM | POA: Diagnosis not present

## 2019-04-30 DIAGNOSIS — R52 Pain, unspecified: Secondary | ICD-10-CM | POA: Diagnosis not present

## 2019-04-30 LAB — CBC WITH DIFFERENTIAL/PLATELET
Abs Immature Granulocytes: 0 10*3/uL (ref 0.00–0.07)
Basophils Absolute: 0 10*3/uL (ref 0.0–0.1)
Basophils Relative: 0 %
Eosinophils Absolute: 0 10*3/uL (ref 0.0–0.5)
Eosinophils Relative: 1 %
HCT: 43.3 % (ref 39.0–52.0)
Hemoglobin: 14.8 g/dL (ref 13.0–17.0)
Immature Granulocytes: 0 %
Lymphocytes Relative: 19 %
Lymphs Abs: 0.3 10*3/uL — ABNORMAL LOW (ref 0.7–4.0)
MCH: 29.7 pg (ref 26.0–34.0)
MCHC: 34.2 g/dL (ref 30.0–36.0)
MCV: 86.9 fL (ref 80.0–100.0)
Monocytes Absolute: 0.1 10*3/uL (ref 0.1–1.0)
Monocytes Relative: 8 %
Neutro Abs: 1 10*3/uL — ABNORMAL LOW (ref 1.7–7.7)
Neutrophils Relative %: 72 %
Platelets: 133 10*3/uL — ABNORMAL LOW (ref 150–400)
RBC: 4.98 MIL/uL (ref 4.22–5.81)
RDW: 12.2 % (ref 11.5–15.5)
WBC: 1.4 10*3/uL — CL (ref 4.0–10.5)
nRBC: 0 % (ref 0.0–0.2)

## 2019-04-30 LAB — COMPREHENSIVE METABOLIC PANEL
ALT: 41 U/L (ref 0–44)
AST: 25 U/L (ref 15–41)
Albumin: 3.1 g/dL — ABNORMAL LOW (ref 3.5–5.0)
Alkaline Phosphatase: 140 U/L — ABNORMAL HIGH (ref 38–126)
Anion gap: 9 (ref 5–15)
BUN: 17 mg/dL (ref 6–20)
CO2: 22 mmol/L (ref 22–32)
Calcium: 8.4 mg/dL — ABNORMAL LOW (ref 8.9–10.3)
Chloride: 105 mmol/L (ref 98–111)
Creatinine, Ser: 0.76 mg/dL (ref 0.61–1.24)
GFR calc Af Amer: >60 mL/min (ref >60)
GFR calc non Af Amer: >60 mL/min (ref >60)
Glucose, Bld: 130 mg/dL — ABNORMAL HIGH (ref 70–99)
Potassium: 3.5 mmol/L (ref 3.5–5.1)
Sodium: 136 mmol/L (ref 135–145)
Total Bilirubin: 1.6 mg/dL — ABNORMAL HIGH (ref 0.3–1.2)
Total Protein: 5.4 g/dL — ABNORMAL LOW (ref 6.5–8.1)

## 2019-04-30 LAB — LIPASE, BLOOD: Lipase: 24 U/L (ref 11–51)

## 2019-04-30 MED ORDER — HYDROMORPHONE HCL 1 MG/ML IJ SOLN
1.0000 mg | Freq: Once | INTRAMUSCULAR | Status: AC
  Administered 2019-04-30: 1 mg via INTRAVENOUS
  Filled 2019-04-30: qty 1

## 2019-04-30 MED ORDER — IOHEXOL 300 MG/ML  SOLN
100.0000 mL | Freq: Once | INTRAMUSCULAR | Status: AC | PRN
  Administered 2019-04-30: 100 mL via INTRAVENOUS

## 2019-04-30 MED ORDER — HYDROMORPHONE HCL 1 MG/ML IJ SOLN
1.0000 mg | Freq: Once | INTRAMUSCULAR | Status: AC
  Administered 2019-05-01: 1 mg via INTRAVENOUS
  Filled 2019-04-30: qty 1

## 2019-04-30 MED ORDER — ONDANSETRON HCL 4 MG/2ML IJ SOLN
4.0000 mg | Freq: Once | INTRAMUSCULAR | Status: AC
  Administered 2019-04-30: 4 mg via INTRAVENOUS
  Filled 2019-04-30: qty 2

## 2019-04-30 MED ORDER — PIPERACILLIN-TAZOBACTAM 3.375 G IVPB 30 MIN
3.3750 g | Freq: Once | INTRAVENOUS | Status: AC
  Administered 2019-04-30: 3.375 g via INTRAVENOUS
  Filled 2019-04-30: qty 50

## 2019-04-30 MED ORDER — SODIUM CHLORIDE (PF) 0.9 % IJ SOLN
INTRAMUSCULAR | Status: AC
  Filled 2019-04-30: qty 50

## 2019-04-30 MED ORDER — SODIUM CHLORIDE 0.9 % IV BOLUS
1000.0000 mL | Freq: Once | INTRAVENOUS | Status: AC
  Administered 2019-04-30: 1000 mL via INTRAVENOUS

## 2019-04-30 NOTE — ED Notes (Addendum)
Spoke to [place] regarding rapid vs non rapid covid Gertie Fey stated to send non rapid

## 2019-04-30 NOTE — ED Notes (Signed)
Report Called to Camera operator at [facility] [place]

## 2019-04-30 NOTE — ED Triage Notes (Signed)
Patient presented to ed with c/o abdominal pain. Hx. Ca. Patient is c/o 10/10 pain. Patient was given fentanyl 100 mcg, Zofran 4 mg, and NS 500 bolus per ems.

## 2019-04-30 NOTE — H&P (Signed)
CC: Right sided abdominal pain  Requesting provider: Domenic Moras, PA-C  HPI: Darius Fox is a very pleasant 48 y.o. 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 He has since developed other metastasis in his abdomen - peritoneal carcinomatosis particularly in the right hemiabdomen, liver, left adrenal and along his cecum/ascending colon. He is currently on an investigational study drug - IDE 196 after previously being on nivolumab and ipilimumab under Dr. Debroah Baller. He presented to the ED today with an approximate 5-6d hx of abdominal discomfort. Initially thought he had food poisoning Mon/Tue but pain developed and became more severe - today was the worst it has been. It was more generalized but today seemed more localized to RLQ. He had n/v today with the pain - this has since subsided.   Past Medical History:  Diagnosis Date   Cancer [facility]) 2009   eye - right (below retina)   GERD (gastroesophageal reflux disease)    Mass    chest wall    Past Surgical History:  Procedure Laterality Date   EYE SURGERY  2009   due to cancer...   EYE SURGERY     09/20/15 PER DR Carlis Abbott PT CAN ONLY BE SCANNED ON 1.5 TESLA   INGUINAL HERNIA REPAIR Right 1994   MASS EXCISION  08/29/2011   Procedure: MINOR EXCISION OF MASS;  Surgeon: Adin Hector, MD;  Location: [place];  Service: General;  Laterality: Left;  local excision of left chest wall mass    MASS EXCISION N/A 04/11/2014   Procedure: MINOR EXCISION OF EPIDERMOID CYSTS FROM BACK X2;  Surgeon: Fanny Skates, MD;  Location: [place];  Service: General;  Laterality: N/A;    Family History  Problem Relation Age of Onset   Diabetes Mother    Hypertension Father    Prostate cancer Paternal Grandfather    Colon cancer Neg Hx    Liver cancer Neg  Hx    Pancreatic cancer Neg Hx    Rectal cancer Neg Hx    Stomach cancer Neg Hx     Social:  reports that he has never smoked. He has never used smokeless tobacco. He reports current alcohol use. He reports that he does not use drugs.  Allergies: No Known Allergies  Medications: I have reviewed the patient's current medications.  Results for orders placed or performed during the hospital encounter of 04/30/19 (from the past 48 hour(s))  CBC with Differential     Status: Abnormal   Collection Time: 04/30/19  5:30 PM  Result Value Ref Range   WBC 1.4 (LL) 4.0 - 10.5 K/uL    Comment: Karrissa Parchment Fox CONFIRMED ON SMEAR THIS CRITICAL RESULT HAS VERIFIED AND BEEN CALLED TO M,YOLSELF BY KENNEDY JACKSON ON 11 14 2020 AT 1826, AND HAS BEEN READ BACK. CRITICAL RESULT VERIFIED    RBC 4.98 4.22 - 5.81 MIL/uL   Hemoglobin 14.8 13.0 - 17.0 g/dL   HCT 43.3 39.0 - 52.0 %   MCV 86.9 80.0 - 100.0 fL   MCH 29.7 26.0 - 34.0 pg   MCHC 34.2 30.0 - 36.0 g/dL   RDW 12.2 11.5 - 15.5 %   Platelets 133 (L) 150 - 400 K/uL   nRBC 0.0 0.0 - 0.2 %   Neutrophils Relative % 72 %   Neutro Abs 1.0 (L) 1.7 -  7.7 K/uL   Lymphocytes Relative 19 %   Lymphs Abs 0.3 (L) 0.7 - 4.0 K/uL   Monocytes Relative 8 %   Monocytes Absolute 0.1 0.1 - 1.0 K/uL   Eosinophils Relative 1 %   Eosinophils Absolute 0.0 0.0 - 0.5 K/uL   Basophils Relative 0 %   Basophils Absolute 0.0 0.0 - 0.1 K/uL   Immature Granulocytes 0 %   Abs Immature Granulocytes 0.00 0.00 - 0.07 K/uL    Comment: Performed at [facility], [place] [address] [place], [place] [address]  Comprehensive metabolic panel     Status: Abnormal   Collection Time: 04/30/19  5:30 PM  Result Value Ref Range   Sodium 136 135 - 145 mmol/L   Potassium 3.5 3.5 - 5.1 mmol/L   Chloride 105 98 - 111 mmol/L   CO2 22 22 - 32 mmol/L   Glucose, Bld 130 (H) 70 - 99 mg/dL   BUN 17 6 - 20 mg/dL   Creatinine, Ser 0.76 0.61 - 1.24 mg/dL   Calcium 8.4 (L) 8.9 -  10.3 mg/dL   Total Protein 5.4 (L) 6.5 - 8.1 g/dL   Albumin 3.1 (L) 3.5 - 5.0 g/dL   AST 25 15 - 41 U/L   ALT 41 0 - 44 U/L   Alkaline Phosphatase 140 (H) 38 - 126 U/L   Total Bilirubin 1.6 (H) 0.3 - 1.2 mg/dL   GFR calc non Af Amer >60 >60 mL/min   GFR calc Af Amer >60 >60 mL/min   Anion gap 9 5 - 15    Comment: Performed at [facility], [place] [address], [place], [place] [address]  Lipase, blood     Status: None   Collection Time: 04/30/19  5:30 PM  Result Value Ref Range   Lipase 24 11 - 51 U/L    Comment: Performed at [facility], [place] [address], [place], [place] [address]    Ct Abdomen Pelvis W Contrast  Result Date: 04/30/2019 CLINICAL DATA:  Abdomen pain history of cancer with liver Mets EXAM: CT ABDOMEN AND PELVIS WITH CONTRAST TECHNIQUE: Multidetector CT imaging of the abdomen and pelvis was performed using the standard protocol following bolus administration of intravenous contrast. CONTRAST:  165m OMNIPAQUE IOHEXOL 300 MG/ML  SOLN COMPARISON:  09/13/2015 FINDINGS: Lower chest: Lung bases demonstrate no acute consolidation or pleural effusion. The heart size is normal. Hepatobiliary: Multiple hypodense liver masses, new since 2017 comparison CT. Dominant mass lesion is in the left hepatic lobe and measures 4.7 by 2.7 cm. Interval partial right hepatectomy with removal of previously noted large metastatic mass. Interval cholecystectomy. No significant biliary dilatation. Pancreas: Unremarkable. No pancreatic ductal dilatation or surrounding inflammatory changes. Spleen: Normal in size without focal abnormality. Adrenals/Urinary Tract: Left adrenal gland is normal. Multiple small nodules either abutting or arising from the right adrenal gland. Multiple adjacent soft tissue masses between the kidney and liver measuring up to 2.6 cm. Kidneys show no hydronephrosis. The urinary bladder is normal Stomach/Bowel: Stomach nonenlarged. No dilated small  bowel. Markedly enlarged appendix measuring up to 23 mm. Calcified stone at the origin of the appendix measuring 16 mm. Mucosal wall enhancement and surrounding inflammation. Ovoid filling defect within the lumen of the appendix, either in septated material versus noncalcified stone. Large volume of stool in the colon. Vascular/Lymphatic: Nonaneurysmal aorta. Numerous small mesenteric root lymph nodes. These measure up to 10 mm. Enlarged retroperitoneal lymph nodes measuring up to 9 mm short axis. Reproductive:  Heterogenous enhancement Other: No free air. Moderate ascites in the pelvis and small ascites in the upper abdomen. Ascites is slightly dense, consistent with complex ascites. Multiple intraperitoneal masses. Right lower quadrant soft tissue mass measuring 3.5 cm. Multiple clustered nodules adjacent to the ascending colon. Right and left upper quadrant anterior peritoneal nodules. Musculoskeletal: No acute or suspicious osseous lesion. IMPRESSION: 1. Markedly enlarged appendix with mucosal enhancement and at least 1 appendicoliths at the origin of the appendix. Ovoid intraluminal mass or inspissated material within the appendix. Findings concerning for an acute appendicitis. Appendix: Location: Right lower quadrant Diameter: 23 mm Appendicolith: Positive Mucosal hyper-enhancement: Positive Extraluminal gas: Negative Periappendiceal collection: No rim enhancing collection. Prominent surrounding inflammatory changes and generalized fluid in the right lower quadrant. 2. Interval partial right hepatectomy. Multiple hypodense liver masses, measuring up to 4.7 cm in the left hepatic lobe, concerning for metastatic disease, new since 2017 comparison studies. 3. New slightly dense abdominopelvic ascites. Numerous intraperitoneal nodules and masses, also concerning for metastatic disease, new since 2017. Electronically Signed   By: Donavan Foil M.D.   On: 04/30/2019 19:31    ROS - all of the below systems have  been reviewed with the patient and positives are indicated with bold text General: chills, fever or night sweats Eyes: blurry vision or double vision ENT: epistaxis or sore throat Allergy/Immunology: itchy/watery eyes or nasal congestion Hematologic/Lymphatic: bleeding problems, blood clots or swollen lymph nodes Endocrine: temperature intolerance or unexpected weight changes Breast: new or changing breast lumps or nipple discharge Resp: cough, shortness of breath, or wheezing CV: chest pain or dyspnea on exertion GI: as per HPI GU: dysuria, trouble voiding, or hematuria MSK: joint pain or joint stiffness Neuro: TIA or stroke symptoms Derm: pruritus and skin lesion changes Psych: anxiety and depression  PE Blood pressure 105/67, pulse 100, temperature 97.6 F (36.4 C), temperature source Oral, resp. rate 20, SpO2 93 %. Constitutional: NAD; conversant; no deformities; appears comfortable sitting in bed Eyes: Moist conjunctiva; no lid lag; anicteric; pupils pinpoint but equal Neck: Trachea midline Lungs: Normal respiratory effort CV: RRR; no pitting edema GI: Abd soft; focally ttp in RLQ/R lateral abdomen; no rebound; focal guarding; no left sided tenderness; no palpable hepatosplenomegaly MSK: Normal gait; no clubbing/cyanosis Psychiatric: Appropriate affect; alert and oriented x3 Lymphatic: No palpable cervical or axillary lymphadenopathy  Results for orders placed or performed during the hospital encounter of 04/30/19 (from the past 48 hour(s))  CBC with Differential     Status: Abnormal   Collection Time: 04/30/19  5:30 PM  Result Value Ref Range   WBC 1.4 (LL) 4.0 - 10.5 K/uL    Comment: Natalina Wieting Fox CONFIRMED ON SMEAR THIS CRITICAL RESULT HAS VERIFIED AND BEEN CALLED TO M,YOLSELF BY KENNEDY JACKSON ON 11 14 2020 AT 1826, AND HAS BEEN READ BACK. CRITICAL RESULT VERIFIED    RBC 4.98 4.22 - 5.81 MIL/uL   Hemoglobin 14.8 13.0 - 17.0 g/dL   HCT 43.3 39.0 - 52.0 %   MCV 86.9  80.0 - 100.0 fL   MCH 29.7 26.0 - 34.0 pg   MCHC 34.2 30.0 - 36.0 g/dL   RDW 12.2 11.5 - 15.5 %   Platelets 133 (L) 150 - 400 K/uL   nRBC 0.0 0.0 - 0.2 %   Neutrophils Relative % 72 %   Neutro Abs 1.0 (L) 1.7 - 7.7 K/uL   Lymphocytes Relative 19 %   Lymphs Abs 0.3 (L) 0.7 - 4.0 K/uL   Monocytes Relative 8 %  Monocytes Absolute 0.1 0.1 - 1.0 K/uL   Eosinophils Relative 1 %   Eosinophils Absolute 0.0 0.0 - 0.5 K/uL   Basophils Relative 0 %   Basophils Absolute 0.0 0.0 - 0.1 K/uL   Immature Granulocytes 0 %   Abs Immature Granulocytes 0.00 0.00 - 0.07 K/uL    Comment: Performed at [facility], [place] [address], [place], [place] [address]  Comprehensive metabolic panel     Status: Abnormal   Collection Time: 04/30/19  5:30 PM  Result Value Ref Range   Sodium 136 135 - 145 mmol/L   Potassium 3.5 3.5 - 5.1 mmol/L   Chloride 105 98 - 111 mmol/L   CO2 22 22 - 32 mmol/L   Glucose, Bld 130 (H) 70 - 99 mg/dL   BUN 17 6 - 20 mg/dL   Creatinine, Ser 0.76 0.61 - 1.24 mg/dL   Calcium 8.4 (L) 8.9 - 10.3 mg/dL   Total Protein 5.4 (L) 6.5 - 8.1 g/dL   Albumin 3.1 (L) 3.5 - 5.0 g/dL   AST 25 15 - 41 U/L   ALT 41 0 - 44 U/L   Alkaline Phosphatase 140 (H) 38 - 126 U/L   Total Bilirubin 1.6 (H) 0.3 - 1.2 mg/dL   GFR calc non Af Amer >60 >60 mL/min   GFR calc Af Amer >60 >60 mL/min   Anion gap 9 5 - 15    Comment: Performed at [facility], [place] [address] [place], [place] [address]  Lipase, blood     Status: None   Collection Time: 04/30/19  5:30 PM  Result Value Ref Range   Lipase 24 11 - 51 U/L    Comment: Performed at [facility], [place] [address], [place], [place] [address]    Ct Abdomen Pelvis W Contrast  Result Date: 04/30/2019 CLINICAL DATA:  Abdomen pain history of cancer with liver Mets EXAM: CT ABDOMEN AND PELVIS WITH CONTRAST TECHNIQUE: Multidetector CT imaging of the abdomen and pelvis was performed using the standard  protocol following bolus administration of intravenous contrast. CONTRAST:  139m OMNIPAQUE IOHEXOL 300 MG/ML  SOLN COMPARISON:  09/13/2015 FINDINGS: Lower chest: Lung bases demonstrate no acute consolidation or pleural effusion. The heart size is normal. Hepatobiliary: Multiple hypodense liver masses, new since 2017 comparison CT. Dominant mass lesion is in the left hepatic lobe and measures 4.7 by 2.7 cm. Interval partial right hepatectomy with removal of previously noted large metastatic mass. Interval cholecystectomy. No significant biliary dilatation. Pancreas: Unremarkable. No pancreatic ductal dilatation or surrounding inflammatory changes. Spleen: Normal in size without focal abnormality. Adrenals/Urinary Tract: Left adrenal gland is normal. Multiple small nodules either abutting or arising from the right adrenal gland. Multiple adjacent soft tissue masses between the kidney and liver measuring up to 2.6 cm. Kidneys show no hydronephrosis. The urinary bladder is normal Stomach/Bowel: Stomach nonenlarged. No dilated small bowel. Markedly enlarged appendix measuring up to 23 mm. Calcified stone at the origin of the appendix measuring 16 mm. Mucosal wall enhancement and surrounding inflammation. Ovoid filling defect within the lumen of the appendix, either in septated material versus noncalcified stone. Large volume of stool in the colon. Vascular/Lymphatic: Nonaneurysmal aorta. Numerous small mesenteric root lymph nodes. These measure up to 10 mm. Enlarged retroperitoneal lymph nodes measuring up to 9 mm short axis. Reproductive: Heterogenous enhancement Other: No free air. Moderate ascites in the pelvis and small ascites in the upper abdomen. Ascites is slightly dense, consistent with complex ascites. Multiple  intraperitoneal masses. Right lower quadrant soft tissue mass measuring 3.5 cm. Multiple clustered nodules adjacent to the ascending colon. Right and left upper quadrant anterior peritoneal nodules.  Musculoskeletal: No acute or suspicious osseous lesion. IMPRESSION: 1. Markedly enlarged appendix with mucosal enhancement and at least 1 appendicoliths at the origin of the appendix. Ovoid intraluminal mass or inspissated material within the appendix. Findings concerning for an acute appendicitis. Appendix: Location: Right lower quadrant Diameter: 23 mm Appendicolith: Positive Mucosal hyper-enhancement: Positive Extraluminal gas: Negative Periappendiceal collection: No rim enhancing collection. Prominent surrounding inflammatory changes and generalized fluid in the right lower quadrant. 2. Interval partial right hepatectomy. Multiple hypodense liver masses, measuring up to 4.7 cm in the left hepatic lobe, concerning for metastatic disease, new since 2017 comparison studies. 3. New slightly dense abdominopelvic ascites. Numerous intraperitoneal nodules and masses, also concerning for metastatic disease, new since 2017. Electronically Signed   By: Donavan Foil M.D.   On: 04/30/2019 19:31   A/P: Byrne Capek is a 48 y.o. male with metastatic occular melanoma, peritoneal carcinomatosis and likely acute appendicitis  -MIVF -IV Zosyn -NPO except medications -His appendix is significantly dilated - 2.4 cm in size without evidence of perforation nor abscess. The bulk of his peritoneal disease appears to be in his right hemiabdomen including along most of his right colon. It's certainly worrisome for a malignant obstruction of the base of the appendix in this scenario - he also has fecalith apparent on imaging. The right ureter is difficult to follow through this as well. If surgery were pursued and an ileocecectomy were necessary, resectability is a major concern. This may end up requiring a multidisciplinary approach. He is currently on an experimental agent at [facility] where he had his partial hepatectomy. Given the concerns outlined, I believe he would benefit most from evaluation at higher level of care ([facility]) for  further treatment plans. -I had a long discussion with him tonight about all the above. We discussed the relevant anatomy, physiology, pathophysiology and reviewed his CT scan together. He is in agreement with transfer. -I met with the ED team overseeing his care to convey all of this - they are reaching out to [facility] for transfer  Sharon Mt. Dema Severin, M.D. General and Colorectal Surgery [facility]

## 2019-04-30 NOTE — ED Provider Notes (Addendum)
[facility] DEPT Provider Note   CSN: [id number] Arrival date & time: 04/30/19  1616     History   Chief Complaint No chief complaint on file.   HPI Darius Fox is a 48 y.o. male.     The history is provided by the patient and medical records. No language interpreter was used.     48 year old male with history of metastatic melanoma to liver presenting for evaluation of abdominal pain.  Patient endorsed progressive worsening abdominal pain ongoing for nearly a week.  Described pain as a burning pressure sensation across his upper abdomen, persistent, with associated nausea without any vomiting.  He also endorsed decrease in bowel movement and decreased flatus for the past 2 days.  Pain initially controlled with over-the-counter medication as well as Percocet however today pain intensified prompting ER visit.  He also endorsed low-grade fever without chills.  He denies any chest pain shortness of breath or productive cough.  He denies any dysuria.  He received cancer care through [facility] and another hospital in [place].  He is currently undergoing some experimental medication for his cancer and states he is not receiving chemo as it does not apply to his disease process.  Upon arrival, patient received fentanyl 100 mcg as well as Zofran 4 mg temporary improve his pain however pain has since returned.  Currently rates his pain as 8 out of 10.  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   EYE SURGERY     09/20/15 PER DR Carlis Abbott PT CAN ONLY BE SCANNED ON 1.5 TESLA   INGUINAL HERNIA REPAIR Right 1994   MASS EXCISION  08/29/2011   Procedure: MINOR EXCISION OF MASS;  Surgeon: Adin Hector, MD;  Location: [facility];  Service: General;  Laterality: Left;  local excision of left chest wall mass    MASS EXCISION N/A 04/11/2014   Procedure: MINOR EXCISION OF EPIDERMOID CYSTS FROM BACK X2;  Surgeon: Fanny Skates, MD;  Location: [facility];  Service: General;  Laterality: N/A;        Home Medications    Prior to Admission medications   Medication Sig Start Date End Date Taking? Authorizing Provider  Cholecalciferol (D3-1000 PO) Take 2,000 mg by mouth.    [provider]  LORazepam (ATIVAN) 1 MG tablet Take 1 tablet (1 mg total) by mouth every 8 (eight) hours. For anxiety 10/01/15   Brunetta Genera, MD  vitamin B-12 (CYANOCOBALAMIN) 500 MCG tablet Take 500 mcg by mouth daily.    [provider]    Family History Family History  Problem Relation Age of Onset   Diabetes Mother    Hypertension Father    Prostate cancer Paternal Grandfather    Colon cancer Neg Hx    Liver cancer Neg Hx    Pancreatic cancer Neg Hx    Rectal cancer Neg Hx    Stomach cancer Neg Hx  Social History Social History   Tobacco Use   Smoking status: Never Smoker   Smokeless tobacco: Never Used  Substance Use Topics   Alcohol use: Yes    Comment: occasional 2 -3 per week   Drug use: No     Allergies   Patient has no known allergies.   Review of Systems Review of Systems  All other systems reviewed and are negative.    Physical Exam Updated Vital Signs BP 102/70    Pulse 93    Temp 97.6 F (36.4 C) (Oral)    Resp 19    SpO2 96%   Physical Exam Vitals signs and nursing note reviewed.  Constitutional:      General: He is not in acute distress.    Appearance: He is well-developed.  HENT:      Head: Atraumatic.  Eyes:     Conjunctiva/sclera: Conjunctivae normal.  Neck:     Musculoskeletal: Neck supple.  Cardiovascular:     Rate and Rhythm: Normal rate and regular rhythm.     Pulses: Normal pulses.     Heart sounds: Normal heart sounds.  Pulmonary:     Effort: Pulmonary effort is normal.     Breath sounds: Normal breath sounds.  Abdominal:     General: Abdomen is flat.     Palpations: Abdomen is soft.     Tenderness: There is abdominal tenderness (Diffuse abdominal tenderness with guarding but no rebound tenderness.). There is guarding. There is no rebound.  Musculoskeletal: Normal range of motion.  Skin:    Findings: No rash.  Neurological:     Mental Status: He is alert and oriented to person, place, and time.  Psychiatric:        Mood and Affect: Mood normal.      ED Treatments / Results  Labs (all labs ordered are listed, but only abnormal results are displayed) Labs Reviewed  CBC WITH DIFFERENTIAL/PLATELET - Abnormal; Notable for the following components:      Result Value   WBC 1.4 (*)    Platelets 133 (*)    Neutro Abs 1.0 (*)    Lymphs Abs 0.3 (*)    All other components within normal limits  COMPREHENSIVE METABOLIC PANEL - Abnormal; Notable for the following components:   Glucose, Bld 130 (*)    Calcium 8.4 (*)    Total Protein 5.4 (*)    Albumin 3.1 (*)    Alkaline Phosphatase 140 (*)    Total Bilirubin 1.6 (*)    All other components within normal limits  SARS CORONAVIRUS 2 (TAT 6-24 HRS)  LIPASE, BLOOD  URINALYSIS, ROUTINE W REFLEX MICROSCOPIC    EKG None  Radiology Ct Abdomen Pelvis W Contrast  Result Date: 04/30/2019 CLINICAL DATA:  Abdomen pain history of cancer with liver Mets EXAM: CT ABDOMEN AND PELVIS WITH CONTRAST TECHNIQUE: Multidetector CT imaging of the abdomen and pelvis was performed using the standard protocol following bolus administration of intravenous contrast. CONTRAST:  120mL OMNIPAQUE IOHEXOL 300 MG/ML  SOLN  COMPARISON:  09/13/2015 FINDINGS: Lower chest: Lung bases demonstrate no acute consolidation or pleural effusion. The heart size is normal. Hepatobiliary: Multiple hypodense liver masses, new since 2017 comparison CT. Dominant mass lesion is in the left hepatic lobe and measures 4.7 by 2.7 cm. Interval partial right hepatectomy with removal of previously noted large metastatic mass. Interval cholecystectomy. No significant biliary dilatation. Pancreas: Unremarkable. No pancreatic ductal dilatation or surrounding inflammatory changes. Spleen: Normal in size without focal abnormality. Adrenals/Urinary Tract:  Left adrenal gland is normal. Multiple small nodules either abutting or arising from the right adrenal gland. Multiple adjacent soft tissue masses between the kidney and liver measuring up to 2.6 cm. Kidneys show no hydronephrosis. The urinary bladder is normal Stomach/Bowel: Stomach nonenlarged. No dilated small bowel. Markedly enlarged appendix measuring up to 23 mm. Calcified stone at the origin of the appendix measuring 16 mm. Mucosal wall enhancement and surrounding inflammation. Ovoid filling defect within the lumen of the appendix, either in septated material versus noncalcified stone. Large volume of stool in the colon. Vascular/Lymphatic: Nonaneurysmal aorta. Numerous small mesenteric root lymph nodes. These measure up to 10 mm. Enlarged retroperitoneal lymph nodes measuring up to 9 mm short axis. Reproductive: Heterogenous enhancement Other: No free air. Moderate ascites in the pelvis and small ascites in the upper abdomen. Ascites is slightly dense, consistent with complex ascites. Multiple intraperitoneal masses. Right lower quadrant soft tissue mass measuring 3.5 cm. Multiple clustered nodules adjacent to the ascending colon. Right and left upper quadrant anterior peritoneal nodules. Musculoskeletal: No acute or suspicious osseous lesion. IMPRESSION: 1. Markedly enlarged appendix with mucosal  enhancement and at least 1 appendicoliths at the origin of the appendix. Ovoid intraluminal mass or inspissated material within the appendix. Findings concerning for an acute appendicitis. Appendix: Location: Right lower quadrant Diameter: 23 mm Appendicolith: Positive Mucosal hyper-enhancement: Positive Extraluminal gas: Negative Periappendiceal collection: No rim enhancing collection. Prominent surrounding inflammatory changes and generalized fluid in the right lower quadrant. 2. Interval partial right hepatectomy. Multiple hypodense liver masses, measuring up to 4.7 cm in the left hepatic lobe, concerning for metastatic disease, new since 2017 comparison studies. 3. New slightly dense abdominopelvic ascites. Numerous intraperitoneal nodules and masses, also concerning for metastatic disease, new since 2017. Electronically Signed   By: Donavan Foil M.D.   On: 04/30/2019 19:31    Procedures Procedures (including critical care time)  Medications Ordered in ED Medications  sodium chloride (PF) 0.9 % injection (has no administration in time range)  HYDROmorphone (DILAUDID) injection 1 mg (1 mg Intravenous Given 04/30/19 1711)  ondansetron (ZOFRAN) injection 4 mg (4 mg Intravenous Given 04/30/19 1711)  sodium chloride 0.9 % bolus 1,000 mL (0 mLs Intravenous Stopped 04/30/19 2011)  iohexol (OMNIPAQUE) 300 MG/ML solution 100 mL (100 mLs Intravenous Contrast Given 04/30/19 1841)  HYDROmorphone (DILAUDID) injection 1 mg (1 mg Intravenous Given 04/30/19 2023)  ondansetron (ZOFRAN) injection 4 mg (4 mg Intravenous Given 04/30/19 2018)  piperacillin-tazobactam (ZOSYN) IVPB 3.375 g (0 g Intravenous Stopped 04/30/19 2251)     Initial Impression / Assessment and Plan / ED Course  I have reviewed the triage vital signs and the nursing notes.  Pertinent labs & imaging results that were available during my care of the patient were reviewed by me and considered in my medical decision making (see chart for  details).        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  Symptoms concerning for potential obstructive pathology.  Work-up initiated.  8:06 PM Abdominal pelvis CT scan demonstrate evidence of acute appendicitis without report of  abscess or perforation.  Widespread metastatic disease in the abdomen.  No report of any small bowel obstruction.  I have consulted general surgery, Dr. Dema Severin, who agrees to see and admit patient.  He also request to find out patient's current cancer treatment.  Patient reports he is currently on a research experimental medication [id number].  9:30 PM Colorectal surgeon DR. White has seen and evaluated pt.  After reviewing CT result, he felt the appendicitis is complicated by widespread tumor in the abdomen that will likely create significant complication from a surgical stand point.  Dr. Dema Severin request pt to be transfer to [facility] to be under the care of his cancer team for further management.  Pt agrees with the transfer.  Plan to reach out to [facility].    10:51 PM Appreciate consultation from [facility] specialist DR. Harriett Rush who agrees to accept pt to ER for further management of his acute appendicitis with complication.  Pt currently receiving Zosyn and pain medication.  Pain is controlled, pt agrees with plan. He is stable for transfer.    Domenic Moras, PA-C 04/30/19 2252    Domenic Moras, PA-C 04/30/19 2324    Virgel Manifold, MD 05/04/19 [phone]

## 2019-04-30 NOTE — ED Notes (Signed)
Called [facility] spoke to [employer]

## 2019-05-01 DIAGNOSIS — E279 Disorder of adrenal gland, unspecified: Secondary | ICD-10-CM | POA: Diagnosis not present

## 2019-05-01 DIAGNOSIS — Z923 Personal history of irradiation: Secondary | ICD-10-CM | POA: Diagnosis not present

## 2019-05-01 DIAGNOSIS — K381 Appendicular concretions: Secondary | ICD-10-CM | POA: Diagnosis not present

## 2019-05-01 DIAGNOSIS — Z20828 Contact with and (suspected) exposure to other viral communicable diseases: Secondary | ICD-10-CM | POA: Diagnosis not present

## 2019-05-01 DIAGNOSIS — C787 Secondary malignant neoplasm of liver and intrahepatic bile duct: Secondary | ICD-10-CM | POA: Diagnosis not present

## 2019-05-01 DIAGNOSIS — Z8505 Personal history of malignant neoplasm of liver: Secondary | ICD-10-CM | POA: Diagnosis not present

## 2019-05-01 DIAGNOSIS — K37 Unspecified appendicitis: Secondary | ICD-10-CM | POA: Diagnosis not present

## 2019-05-01 DIAGNOSIS — Z85840 Personal history of malignant neoplasm of eye: Secondary | ICD-10-CM | POA: Diagnosis not present

## 2019-05-01 DIAGNOSIS — Z85820 Personal history of malignant melanoma of skin: Secondary | ICD-10-CM | POA: Diagnosis not present

## 2019-05-01 DIAGNOSIS — K769 Liver disease, unspecified: Secondary | ICD-10-CM | POA: Diagnosis not present

## 2019-05-01 DIAGNOSIS — Z79899 Other long term (current) drug therapy: Secondary | ICD-10-CM | POA: Diagnosis not present

## 2019-05-01 DIAGNOSIS — R188 Other ascites: Secondary | ICD-10-CM | POA: Diagnosis not present

## 2019-05-01 DIAGNOSIS — K353 Acute appendicitis with localized peritonitis, without perforation or gangrene: Secondary | ICD-10-CM | POA: Diagnosis not present

## 2019-05-01 DIAGNOSIS — C786 Secondary malignant neoplasm of retroperitoneum and peritoneum: Secondary | ICD-10-CM | POA: Diagnosis not present

## 2019-05-01 DIAGNOSIS — R1010 Upper abdominal pain, unspecified: Secondary | ICD-10-CM | POA: Diagnosis not present

## 2019-05-01 DIAGNOSIS — C799 Secondary malignant neoplasm of unspecified site: Secondary | ICD-10-CM | POA: Diagnosis not present

## 2019-05-01 DIAGNOSIS — K3580 Unspecified acute appendicitis: Secondary | ICD-10-CM | POA: Diagnosis not present

## 2019-05-01 LAB — URINALYSIS, ROUTINE W REFLEX MICROSCOPIC
Bacteria, UA: NONE SEEN
Bilirubin Urine: NEGATIVE
Glucose, UA: NEGATIVE mg/dL
Hgb urine dipstick: NEGATIVE
Ketones, ur: 20 mg/dL — AB
Leukocytes,Ua: NEGATIVE
Nitrite: NEGATIVE
Protein, ur: 30 mg/dL — AB
Specific Gravity, Urine: >1.046 — ABNORMAL HIGH (ref 1.005–1.030)
pH: 5 (ref 5.0–8.0)

## 2019-05-01 LAB — SARS CORONAVIRUS 2 (TAT 6-24 HRS): SARS Coronavirus 2: NEGATIVE

## 2019-05-01 MED ORDER — HYDROMORPHONE HCL 1 MG/ML IJ SOLN
1.0000 mg | Freq: Once | INTRAMUSCULAR | Status: AC
  Administered 2019-05-01: 1 mg via INTRAVENOUS
  Filled 2019-05-01: qty 1

## 2019-05-01 MED ORDER — ONDANSETRON HCL 4 MG/2ML IJ SOLN
4.0000 mg | Freq: Once | INTRAMUSCULAR | Status: AC
  Administered 2019-05-01: 4 mg via INTRAVENOUS
  Filled 2019-05-01: qty 2

## 2019-05-01 NOTE — ED Notes (Signed)
[facility] here for transport to [facility].

## 2019-05-05 MED ORDER — GENERIC EXTERNAL MEDICATION
3.38 | Status: DC
Start: 2019-05-05 — End: 2019-05-05

## 2019-05-05 MED ORDER — LIDOCAINE HCL 1 % IJ SOLN
0.50 | INTRAMUSCULAR | Status: DC
Start: ? — End: 2019-05-05

## 2019-05-05 MED ORDER — LORAZEPAM 0.5 MG PO TABS
0.50 | ORAL_TABLET | ORAL | Status: DC
Start: ? — End: 2019-05-05

## 2019-05-05 MED ORDER — GENERIC EXTERNAL MEDICATION
6.25 | Status: DC
Start: ? — End: 2019-05-05

## 2019-05-05 MED ORDER — ENOXAPARIN SODIUM 40 MG/0.4ML ~~LOC~~ SOLN
40.00 | SUBCUTANEOUS | Status: DC
Start: 2019-05-12 — End: 2019-05-05

## 2019-05-05 MED ORDER — OXYCODONE HCL 5 MG PO TABS
5.00 | ORAL_TABLET | ORAL | Status: DC
Start: ? — End: 2019-05-05

## 2019-05-05 MED ORDER — ACETAMINOPHEN 325 MG PO TABS
975.00 | ORAL_TABLET | ORAL | Status: DC
Start: ? — End: 2019-05-05

## 2019-05-05 MED ORDER — LOVENOX 150 MG/ML ~~LOC~~ SOLN
0.50 | SUBCUTANEOUS | Status: DC
Start: ? — End: 2019-05-05

## 2019-05-05 MED ORDER — ONDANSETRON HCL 4 MG/2ML IJ SOLN
4.00 | INTRAMUSCULAR | Status: DC
Start: ? — End: 2019-05-05

## 2019-05-05 MED ORDER — SENNOSIDES-DOCUSATE SODIUM 8.6-50 MG PO TABS
2.00 | ORAL_TABLET | ORAL | Status: DC
Start: 2019-05-11 — End: 2019-05-05

## 2019-05-05 MED ORDER — HYDROXYZINE PAMOATE 25 MG PO CAPS
25.00 | ORAL_CAPSULE | ORAL | Status: DC
Start: ? — End: 2019-05-05

## 2019-05-05 MED ORDER — MELATONIN 3 MG PO TBDP
3.00 | ORAL_TABLET | ORAL | Status: DC
Start: ? — End: 2019-05-05

## 2019-05-11 MED ORDER — IBUPROFEN 600 MG PO TABS
600.00 | ORAL_TABLET | ORAL | Status: DC
Start: ? — End: 2019-05-11

## 2019-05-11 MED ORDER — AMOXICILLIN-POT CLAVULANATE 875-125 MG PO TABS
875.00 | ORAL_TABLET | ORAL | Status: DC
Start: 2019-05-11 — End: 2019-05-11

## 2019-05-11 MED ORDER — SIMETHICONE 80 MG PO CHEW
80.00 | CHEWABLE_TABLET | ORAL | Status: DC
Start: ? — End: 2019-05-11

## 2019-05-11 MED ORDER — HYDROMORPHONE HCL 1 MG/ML IJ SOLN
0.50 | INTRAMUSCULAR | Status: DC
Start: ? — End: 2019-05-11

## 2019-05-17 DIAGNOSIS — C799 Secondary malignant neoplasm of unspecified site: Secondary | ICD-10-CM | POA: Diagnosis not present

## 2019-06-11 DIAGNOSIS — C787 Secondary malignant neoplasm of liver and intrahepatic bile duct: Secondary | ICD-10-CM | POA: Diagnosis not present

## 2019-06-11 DIAGNOSIS — Z85840 Personal history of malignant neoplasm of eye: Secondary | ICD-10-CM | POA: Diagnosis not present

## 2019-06-11 DIAGNOSIS — R188 Other ascites: Secondary | ICD-10-CM | POA: Diagnosis not present

## 2019-06-11 DIAGNOSIS — R918 Other nonspecific abnormal finding of lung field: Secondary | ICD-10-CM | POA: Diagnosis not present

## 2019-06-22 DIAGNOSIS — H3589 Other specified retinal disorders: Secondary | ICD-10-CM | POA: Diagnosis not present

## 2019-06-22 DIAGNOSIS — C799 Secondary malignant neoplasm of unspecified site: Secondary | ICD-10-CM | POA: Diagnosis not present

## 2019-06-22 DIAGNOSIS — C6931 Malignant neoplasm of right choroid: Secondary | ICD-10-CM | POA: Diagnosis not present

## 2019-06-22 DIAGNOSIS — T66XXXS Radiation sickness, unspecified, sequela: Secondary | ICD-10-CM | POA: Diagnosis not present

## 2019-11-01 DIAGNOSIS — C6931 Malignant neoplasm of right choroid: Secondary | ICD-10-CM | POA: Diagnosis not present

## 2019-12-23 DIAGNOSIS — C799 Secondary malignant neoplasm of unspecified site: Secondary | ICD-10-CM | POA: Diagnosis not present

## 2020-04-03 DIAGNOSIS — Z79899 Other long term (current) drug therapy: Secondary | ICD-10-CM | POA: Diagnosis not present

## 2020-04-03 DIAGNOSIS — C787 Secondary malignant neoplasm of liver and intrahepatic bile duct: Secondary | ICD-10-CM | POA: Diagnosis not present

## 2020-04-03 DIAGNOSIS — Z9221 Personal history of antineoplastic chemotherapy: Secondary | ICD-10-CM | POA: Diagnosis not present

## 2020-04-03 DIAGNOSIS — C799 Secondary malignant neoplasm of unspecified site: Secondary | ICD-10-CM | POA: Diagnosis not present

## 2020-05-02 DIAGNOSIS — J90 Pleural effusion, not elsewhere classified: Secondary | ICD-10-CM | POA: Diagnosis not present

## 2020-05-02 DIAGNOSIS — C799 Secondary malignant neoplasm of unspecified site: Secondary | ICD-10-CM | POA: Diagnosis not present

## 2020-05-02 DIAGNOSIS — R918 Other nonspecific abnormal finding of lung field: Secondary | ICD-10-CM | POA: Diagnosis not present

## 2020-05-02 DIAGNOSIS — C7951 Secondary malignant neoplasm of bone: Secondary | ICD-10-CM | POA: Diagnosis not present

## 2020-05-09 DIAGNOSIS — M531 Cervicobrachial syndrome: Secondary | ICD-10-CM | POA: Diagnosis not present

## 2020-05-09 DIAGNOSIS — M9901 Segmental and somatic dysfunction of cervical region: Secondary | ICD-10-CM | POA: Diagnosis not present

## 2020-05-09 DIAGNOSIS — M791 Myalgia, unspecified site: Secondary | ICD-10-CM | POA: Diagnosis not present

## 2020-05-09 DIAGNOSIS — M9902 Segmental and somatic dysfunction of thoracic region: Secondary | ICD-10-CM | POA: Diagnosis not present

## 2020-05-16 DIAGNOSIS — M9901 Segmental and somatic dysfunction of cervical region: Secondary | ICD-10-CM | POA: Diagnosis not present

## 2020-05-16 DIAGNOSIS — M531 Cervicobrachial syndrome: Secondary | ICD-10-CM | POA: Diagnosis not present

## 2020-05-16 DIAGNOSIS — M9902 Segmental and somatic dysfunction of thoracic region: Secondary | ICD-10-CM | POA: Diagnosis not present

## 2020-05-16 DIAGNOSIS — M791 Myalgia, unspecified site: Secondary | ICD-10-CM | POA: Diagnosis not present

## 2020-05-18 DIAGNOSIS — R530 Neoplastic (malignant) related fatigue: Secondary | ICD-10-CM | POA: Diagnosis not present

## 2020-05-18 DIAGNOSIS — R000 Tachycardia, unspecified: Secondary | ICD-10-CM | POA: Diagnosis not present

## 2020-05-18 DIAGNOSIS — C799 Secondary malignant neoplasm of unspecified site: Secondary | ICD-10-CM | POA: Diagnosis not present

## 2020-05-22 DIAGNOSIS — M791 Myalgia, unspecified site: Secondary | ICD-10-CM | POA: Diagnosis not present

## 2020-05-22 DIAGNOSIS — M9902 Segmental and somatic dysfunction of thoracic region: Secondary | ICD-10-CM | POA: Diagnosis not present

## 2020-05-22 DIAGNOSIS — C787 Secondary malignant neoplasm of liver and intrahepatic bile duct: Secondary | ICD-10-CM | POA: Diagnosis not present

## 2020-05-22 DIAGNOSIS — M531 Cervicobrachial syndrome: Secondary | ICD-10-CM | POA: Diagnosis not present

## 2020-05-22 DIAGNOSIS — C799 Secondary malignant neoplasm of unspecified site: Secondary | ICD-10-CM | POA: Diagnosis not present

## 2020-05-22 DIAGNOSIS — M9901 Segmental and somatic dysfunction of cervical region: Secondary | ICD-10-CM | POA: Diagnosis not present

## 2020-05-22 DIAGNOSIS — C6931 Malignant neoplasm of right choroid: Secondary | ICD-10-CM | POA: Diagnosis not present

## 2020-05-22 DIAGNOSIS — Z9221 Personal history of antineoplastic chemotherapy: Secondary | ICD-10-CM | POA: Diagnosis not present

## 2020-06-01 DIAGNOSIS — M9901 Segmental and somatic dysfunction of cervical region: Secondary | ICD-10-CM | POA: Diagnosis not present

## 2020-06-01 DIAGNOSIS — M531 Cervicobrachial syndrome: Secondary | ICD-10-CM | POA: Diagnosis not present

## 2020-06-01 DIAGNOSIS — M791 Myalgia, unspecified site: Secondary | ICD-10-CM | POA: Diagnosis not present

## 2020-06-01 DIAGNOSIS — M9902 Segmental and somatic dysfunction of thoracic region: Secondary | ICD-10-CM | POA: Diagnosis not present

## 2020-06-06 DIAGNOSIS — M791 Myalgia, unspecified site: Secondary | ICD-10-CM | POA: Diagnosis not present

## 2020-06-06 DIAGNOSIS — M531 Cervicobrachial syndrome: Secondary | ICD-10-CM | POA: Diagnosis not present

## 2020-06-06 DIAGNOSIS — M9902 Segmental and somatic dysfunction of thoracic region: Secondary | ICD-10-CM | POA: Diagnosis not present

## 2020-06-06 DIAGNOSIS — M9901 Segmental and somatic dysfunction of cervical region: Secondary | ICD-10-CM | POA: Diagnosis not present

## 2020-08-01 ENCOUNTER — Other Ambulatory Visit: Payer: Self-pay | Admitting: Family Medicine

## 2020-08-01 ENCOUNTER — Other Ambulatory Visit (HOSPITAL_COMMUNITY): Payer: Self-pay | Admitting: Family Medicine

## 2020-08-01 DIAGNOSIS — R188 Other ascites: Secondary | ICD-10-CM

## 2020-08-02 ENCOUNTER — Other Ambulatory Visit (HOSPITAL_COMMUNITY): Payer: Self-pay | Admitting: Internal Medicine

## 2020-08-02 ENCOUNTER — Other Ambulatory Visit: Payer: Self-pay | Admitting: Internal Medicine

## 2020-08-02 DIAGNOSIS — R188 Other ascites: Secondary | ICD-10-CM

## 2020-08-05 ENCOUNTER — Encounter (HOSPITAL_COMMUNITY): Payer: Self-pay

## 2020-08-05 ENCOUNTER — Inpatient Hospital Stay (HOSPITAL_COMMUNITY)
Admission: EM | Admit: 2020-08-05 | Discharge: 2020-08-14 | DRG: 441 | Disposition: E | Payer: 59 | Attending: Internal Medicine | Admitting: Internal Medicine

## 2020-08-05 ENCOUNTER — Other Ambulatory Visit: Payer: Self-pay

## 2020-08-05 ENCOUNTER — Emergency Department (HOSPITAL_COMMUNITY): Payer: 59

## 2020-08-05 DIAGNOSIS — F419 Anxiety disorder, unspecified: Secondary | ICD-10-CM | POA: Diagnosis present

## 2020-08-05 DIAGNOSIS — D649 Anemia, unspecified: Secondary | ICD-10-CM

## 2020-08-05 DIAGNOSIS — Z6823 Body mass index (BMI) 23.0-23.9, adult: Secondary | ICD-10-CM | POA: Diagnosis not present

## 2020-08-05 DIAGNOSIS — F32A Depression, unspecified: Secondary | ICD-10-CM | POA: Diagnosis present

## 2020-08-05 DIAGNOSIS — R578 Other shock: Secondary | ICD-10-CM | POA: Diagnosis not present

## 2020-08-05 DIAGNOSIS — Z833 Family history of diabetes mellitus: Secondary | ICD-10-CM | POA: Diagnosis not present

## 2020-08-05 DIAGNOSIS — Y848 Other medical procedures as the cause of abnormal reaction of the patient, or of later complication, without mention of misadventure at the time of the procedure: Secondary | ICD-10-CM | POA: Diagnosis not present

## 2020-08-05 DIAGNOSIS — C787 Secondary malignant neoplasm of liver and intrahepatic bile duct: Secondary | ICD-10-CM | POA: Diagnosis present

## 2020-08-05 DIAGNOSIS — Z66 Do not resuscitate: Secondary | ICD-10-CM | POA: Diagnosis not present

## 2020-08-05 DIAGNOSIS — K7290 Hepatic failure, unspecified without coma: Secondary | ICD-10-CM

## 2020-08-05 DIAGNOSIS — E872 Acidosis: Secondary | ICD-10-CM | POA: Diagnosis present

## 2020-08-05 DIAGNOSIS — R64 Cachexia: Secondary | ICD-10-CM | POA: Diagnosis present

## 2020-08-05 DIAGNOSIS — C6941 Malignant neoplasm of right ciliary body: Secondary | ICD-10-CM | POA: Diagnosis present

## 2020-08-05 DIAGNOSIS — E871 Hypo-osmolality and hyponatremia: Secondary | ICD-10-CM | POA: Diagnosis present

## 2020-08-05 DIAGNOSIS — T8092XA Unspecified transfusion reaction, initial encounter: Secondary | ICD-10-CM | POA: Diagnosis not present

## 2020-08-05 DIAGNOSIS — K7200 Acute and subacute hepatic failure without coma: Principal | ICD-10-CM | POA: Diagnosis present

## 2020-08-05 DIAGNOSIS — C439 Malignant melanoma of skin, unspecified: Secondary | ICD-10-CM

## 2020-08-05 DIAGNOSIS — K219 Gastro-esophageal reflux disease without esophagitis: Secondary | ICD-10-CM | POA: Diagnosis present

## 2020-08-05 DIAGNOSIS — Z8042 Family history of malignant neoplasm of prostate: Secondary | ICD-10-CM

## 2020-08-05 DIAGNOSIS — R180 Malignant ascites: Secondary | ICD-10-CM | POA: Diagnosis present

## 2020-08-05 DIAGNOSIS — Z8249 Family history of ischemic heart disease and other diseases of the circulatory system: Secondary | ICD-10-CM

## 2020-08-05 DIAGNOSIS — J189 Pneumonia, unspecified organism: Secondary | ICD-10-CM | POA: Diagnosis present

## 2020-08-05 DIAGNOSIS — C7971 Secondary malignant neoplasm of right adrenal gland: Secondary | ICD-10-CM | POA: Diagnosis present

## 2020-08-05 DIAGNOSIS — C786 Secondary malignant neoplasm of retroperitoneum and peritoneum: Secondary | ICD-10-CM | POA: Diagnosis present

## 2020-08-05 DIAGNOSIS — C7972 Secondary malignant neoplasm of left adrenal gland: Secondary | ICD-10-CM | POA: Diagnosis present

## 2020-08-05 DIAGNOSIS — G9341 Metabolic encephalopathy: Secondary | ICD-10-CM | POA: Diagnosis present

## 2020-08-05 DIAGNOSIS — E43 Unspecified severe protein-calorie malnutrition: Secondary | ICD-10-CM | POA: Diagnosis present

## 2020-08-05 DIAGNOSIS — Z20822 Contact with and (suspected) exposure to covid-19: Secondary | ICD-10-CM | POA: Diagnosis present

## 2020-08-05 DIAGNOSIS — R109 Unspecified abdominal pain: Secondary | ICD-10-CM | POA: Diagnosis present

## 2020-08-05 DIAGNOSIS — C800 Disseminated malignant neoplasm, unspecified: Secondary | ICD-10-CM

## 2020-08-05 DIAGNOSIS — K7682 Hepatic encephalopathy: Secondary | ICD-10-CM | POA: Diagnosis present

## 2020-08-05 LAB — URINALYSIS, ROUTINE W REFLEX MICROSCOPIC
Bilirubin Urine: NEGATIVE
Glucose, UA: NEGATIVE mg/dL
Hgb urine dipstick: NEGATIVE
Ketones, ur: 5 mg/dL — AB
Leukocytes,Ua: NEGATIVE
Nitrite: NEGATIVE
Protein, ur: NEGATIVE mg/dL
Specific Gravity, Urine: 1.036 — ABNORMAL HIGH (ref 1.005–1.030)
pH: 6 (ref 5.0–8.0)

## 2020-08-05 LAB — COMPREHENSIVE METABOLIC PANEL
ALT: 178 U/L — ABNORMAL HIGH (ref 0–44)
ALT: 198 U/L — ABNORMAL HIGH (ref 0–44)
AST: 233 U/L — ABNORMAL HIGH (ref 15–41)
AST: 253 U/L — ABNORMAL HIGH (ref 15–41)
Albumin: 2.3 g/dL — ABNORMAL LOW (ref 3.5–5.0)
Albumin: 1.8 g/dL — ABNORMAL LOW (ref 3.5–5.0)
Alkaline Phosphatase: 782 U/L — ABNORMAL HIGH (ref 38–126)
Alkaline Phosphatase: 679 U/L — ABNORMAL HIGH (ref 38–126)
Anion gap: 12 (ref 5–15)
Anion gap: 14 (ref 5–15)
BUN: 33 mg/dL — ABNORMAL HIGH (ref 6–20)
BUN: 33 mg/dL — ABNORMAL HIGH (ref 6–20)
CO2: 23 mmol/L (ref 22–32)
CO2: 20 mmol/L — ABNORMAL LOW (ref 22–32)
Calcium: 8.1 mg/dL — ABNORMAL LOW (ref 8.9–10.3)
Calcium: 8 mg/dL — ABNORMAL LOW (ref 8.9–10.3)
Chloride: 94 mmol/L — ABNORMAL LOW (ref 98–111)
Chloride: 101 mmol/L (ref 98–111)
Creatinine, Ser: 0.37 mg/dL — ABNORMAL LOW (ref 0.61–1.24)
Creatinine, Ser: 0.44 mg/dL — ABNORMAL LOW (ref 0.61–1.24)
GFR, Estimated: >60 mL/min (ref >60)
GFR, Estimated: >60 mL/min (ref >60)
Glucose, Bld: 122 mg/dL — ABNORMAL HIGH (ref 70–99)
Glucose, Bld: 88 mg/dL (ref 70–99)
Potassium: 4.3 mmol/L (ref 3.5–5.1)
Potassium: 4.4 mmol/L (ref 3.5–5.1)
Sodium: 129 mmol/L — ABNORMAL LOW (ref 135–145)
Sodium: 135 mmol/L (ref 135–145)
Total Bilirubin: 2.6 mg/dL — ABNORMAL HIGH (ref 0.3–1.2)
Total Bilirubin: 2.9 mg/dL — ABNORMAL HIGH (ref 0.3–1.2)
Total Protein: 5.7 g/dL — ABNORMAL LOW (ref 6.5–8.1)
Total Protein: 4.5 g/dL — ABNORMAL LOW (ref 6.5–8.1)

## 2020-08-05 LAB — CBC WITH DIFFERENTIAL/PLATELET
Abs Immature Granulocytes: 0.75 10*3/uL — ABNORMAL HIGH (ref 0.00–0.07)
Abs Immature Granulocytes: 0 10*3/uL (ref 0.00–0.07)
Band Neutrophils: 0 %
Basophils Absolute: 0 10*3/uL (ref 0.0–0.1)
Basophils Absolute: 0 10*3/uL (ref 0.0–0.1)
Basophils Relative: 0 %
Basophils Relative: 0 %
Blasts: 0 %
Eosinophils Absolute: 0 10*3/uL (ref 0.0–0.5)
Eosinophils Absolute: 0 10*3/uL (ref 0.0–0.5)
Eosinophils Relative: 0 %
Eosinophils Relative: 0 %
HCT: 30.5 % — ABNORMAL LOW (ref 39.0–52.0)
HCT: 21.2 % — ABNORMAL LOW (ref 39.0–52.0)
Hemoglobin: 9.7 g/dL — ABNORMAL LOW (ref 13.0–17.0)
Hemoglobin: 6.6 g/dL — CL (ref 13.0–17.0)
Immature Granulocytes: 7 %
Lymphocytes Relative: 11 %
Lymphocytes Relative: 16 %
Lymphs Abs: 1.1 10*3/uL (ref 0.7–4.0)
Lymphs Abs: 1.6 10*3/uL (ref 0.7–4.0)
MCH: 27.1 pg (ref 26.0–34.0)
MCH: 26.9 pg (ref 26.0–34.0)
MCHC: 31.8 g/dL (ref 30.0–36.0)
MCHC: 31.1 g/dL (ref 30.0–36.0)
MCV: 85.2 fL (ref 80.0–100.0)
MCV: 86.5 fL (ref 80.0–100.0)
Metamyelocytes Relative: 0 %
Monocytes Absolute: 0.3 10*3/uL (ref 0.1–1.0)
Monocytes Absolute: 0.4 10*3/uL (ref 0.1–1.0)
Monocytes Relative: 3 %
Monocytes Relative: 4 %
Myelocytes: 0 %
Neutro Abs: 8.4 10*3/uL — ABNORMAL HIGH (ref 1.7–7.7)
Neutro Abs: 8.3 10*3/uL — ABNORMAL HIGH (ref 1.7–7.7)
Neutrophils Relative %: 79 %
Neutrophils Relative %: 80 %
Other: 0 %
Platelets: 269 10*3/uL (ref 150–400)
Platelets: 292 10*3/uL (ref 150–400)
Promyelocytes Relative: 0 %
RBC: 3.58 MIL/uL — ABNORMAL LOW (ref 4.22–5.81)
RBC: 2.45 MIL/uL — ABNORMAL LOW (ref 4.22–5.81)
RDW: 22.1 % — ABNORMAL HIGH (ref 11.5–15.5)
RDW: 23.2 % — ABNORMAL HIGH (ref 11.5–15.5)
WBC: 10.6 10*3/uL — ABNORMAL HIGH (ref 4.0–10.5)
WBC: 10.3 10*3/uL (ref 4.0–10.5)
nRBC: 1.2 % — ABNORMAL HIGH (ref 0.0–0.2)
nRBC: 4.5 % — ABNORMAL HIGH (ref 0.0–0.2)
nRBC: 5 /100 WBC — ABNORMAL HIGH

## 2020-08-05 LAB — HEMOGLOBIN AND HEMATOCRIT, BLOOD
HCT: 21.1 % — ABNORMAL LOW (ref 39.0–52.0)
HCT: 21.3 % — ABNORMAL LOW (ref 39.0–52.0)
Hemoglobin: 6.3 g/dL — CL (ref 13.0–17.0)
Hemoglobin: 6.1 g/dL — CL (ref 13.0–17.0)

## 2020-08-05 LAB — LACTIC ACID, PLASMA
Lactic Acid, Venous: 4.9 mmol/L (ref 0.5–1.9)
Lactic Acid, Venous: 4.3 mmol/L (ref 0.5–1.9)
Lactic Acid, Venous: 4.5 mmol/L (ref 0.5–1.9)
Lactic Acid, Venous: 6.1 mmol/L (ref 0.5–1.9)

## 2020-08-05 LAB — GLUCOSE, CAPILLARY
Glucose-Capillary: 11 mg/dL — CL (ref 70–99)
Glucose-Capillary: 73 mg/dL (ref 70–99)
Glucose-Capillary: 15 mg/dL — CL (ref 70–99)
Glucose-Capillary: 79 mg/dL (ref 70–99)

## 2020-08-05 LAB — IRON AND TIBC
Iron: 30 ug/dL — ABNORMAL LOW (ref 45–182)
Saturation Ratios: 16 % — ABNORMAL LOW (ref 17.9–39.5)
TIBC: 193 ug/dL — ABNORMAL LOW (ref 250–450)
UIBC: 163 ug/dL

## 2020-08-05 LAB — AMMONIA
Ammonia: 150 umol/L — ABNORMAL HIGH (ref 9–35)
Ammonia: 108 umol/L — ABNORMAL HIGH (ref 9–35)

## 2020-08-05 LAB — SARS CORONAVIRUS 2 (TAT 6-24 HRS): SARS Coronavirus 2: NEGATIVE

## 2020-08-05 LAB — MRSA PCR SCREENING: MRSA by PCR: POSITIVE — AB

## 2020-08-05 LAB — PREPARE RBC (CROSSMATCH)

## 2020-08-05 LAB — LIPASE, BLOOD: Lipase: 29 U/L (ref 11–51)

## 2020-08-05 LAB — HIV ANTIBODY (ROUTINE TESTING W REFLEX): HIV Screen 4th Generation wRfx: NONREACTIVE

## 2020-08-05 LAB — APTT: aPTT: 30 seconds (ref 24–36)

## 2020-08-05 LAB — STREP PNEUMONIAE URINARY ANTIGEN: Strep Pneumo Urinary Antigen: NEGATIVE

## 2020-08-05 LAB — MAGNESIUM: Magnesium: 1.9 mg/dL (ref 1.7–2.4)

## 2020-08-05 LAB — ABO/RH: ABO/RH(D): A POS

## 2020-08-05 LAB — PROTIME-INR
INR: 1.3 — ABNORMAL HIGH (ref 0.8–1.2)
Prothrombin Time: 15.5 seconds — ABNORMAL HIGH (ref 11.4–15.2)

## 2020-08-05 MED ORDER — LACTULOSE ENEMA
300.0000 mL | Freq: Three times a day (TID) | ORAL | Status: DC
  Filled 2020-08-05 (×2): qty 300

## 2020-08-05 MED ORDER — SODIUM CHLORIDE 0.9 % IV SOLN
2.0000 g | INTRAVENOUS | Status: DC
  Administered 2020-08-05: 2 g via INTRAVENOUS
  Filled 2020-08-05: qty 2
  Filled 2020-08-05: qty 20

## 2020-08-05 MED ORDER — MUPIROCIN 2 % EX OINT
1.0000 "application " | TOPICAL_OINTMENT | Freq: Two times a day (BID) | CUTANEOUS | Status: DC
  Administered 2020-08-06: 1 via NASAL
  Filled 2020-08-05: qty 22

## 2020-08-05 MED ORDER — SODIUM CHLORIDE 0.9 % IV SOLN
500.0000 mg | INTRAVENOUS | Status: DC
  Administered 2020-08-05: 500 mg via INTRAVENOUS
  Filled 2020-08-05: qty 500

## 2020-08-05 MED ORDER — EPINEPHRINE 0.3 MG/0.3ML IJ SOAJ
0.3000 mg | Freq: Once | INTRAMUSCULAR | Status: AC
  Administered 2020-08-05: 0.3 mg via INTRAMUSCULAR
  Filled 2020-08-05: qty 0.6

## 2020-08-05 MED ORDER — METHYLPREDNISOLONE SODIUM SUCC 40 MG IJ SOLR
40.0000 mg | Freq: Once | INTRAMUSCULAR | Status: AC
  Administered 2020-08-05: 40 mg via INTRAVENOUS
  Filled 2020-08-05: qty 1

## 2020-08-05 MED ORDER — LACTULOSE 10 GM/15ML PO SOLN
20.0000 g | Freq: Once | ORAL | Status: DC
  Filled 2020-08-05: qty 30

## 2020-08-05 MED ORDER — ONDANSETRON HCL 4 MG/2ML IJ SOLN
4.0000 mg | Freq: Once | INTRAMUSCULAR | Status: AC
  Administered 2020-08-05: 4 mg via INTRAVENOUS
  Filled 2020-08-05: qty 2

## 2020-08-05 MED ORDER — DEXTROSE 50 % IV SOLN
INTRAVENOUS | Status: AC
  Administered 2020-08-05: 50 mL
  Filled 2020-08-05: qty 50

## 2020-08-05 MED ORDER — SODIUM CHLORIDE 0.9 % IV BOLUS
500.0000 mL | Freq: Once | INTRAVENOUS | Status: AC
  Administered 2020-08-05: 500 mL via INTRAVENOUS

## 2020-08-05 MED ORDER — DIPHENHYDRAMINE HCL 50 MG/ML IJ SOLN
50.0000 mg | Freq: Once | INTRAMUSCULAR | Status: AC
  Administered 2020-08-05: 50 mg via INTRAVENOUS
  Filled 2020-08-05: qty 1

## 2020-08-05 MED ORDER — FENTANYL CITRATE (PF) 100 MCG/2ML IJ SOLN
50.0000 ug | Freq: Once | INTRAMUSCULAR | Status: AC
  Administered 2020-08-05: 50 ug via INTRAVENOUS
  Filled 2020-08-05: qty 2

## 2020-08-05 MED ORDER — CHLORHEXIDINE GLUCONATE CLOTH 2 % EX PADS
6.0000 | MEDICATED_PAD | Freq: Every day | CUTANEOUS | Status: DC
  Administered 2020-08-05: 6 via TOPICAL

## 2020-08-05 MED ORDER — DEXTROSE-NACL 5-0.9 % IV SOLN: INTRAVENOUS | Status: DC

## 2020-08-05 MED ORDER — DEXTROSE 50 % IV SOLN
50.0000 mL | INTRAVENOUS | Status: DC | PRN
  Administered 2020-08-06: 50 mL via INTRAVENOUS
  Filled 2020-08-05: qty 50

## 2020-08-05 MED ORDER — ENOXAPARIN SODIUM 40 MG/0.4ML ~~LOC~~ SOLN
40.0000 mg | SUBCUTANEOUS | Status: DC
  Administered 2020-08-05: 40 mg via SUBCUTANEOUS
  Filled 2020-08-05: qty 0.4

## 2020-08-05 MED ORDER — SODIUM CHLORIDE 0.9 % IV BOLUS
500.0000 mL | Freq: Once | INTRAVENOUS | Status: AC | PRN
  Administered 2020-08-05: 500 mL via INTRAVENOUS

## 2020-08-05 MED ORDER — DEXTROSE 50 % IV SOLN
INTRAVENOUS | Status: AC
  Filled 2020-08-05: qty 50

## 2020-08-05 MED ORDER — ONDANSETRON HCL 4 MG/2ML IJ SOLN: 4.0000 mg | Freq: Four times a day (QID) | INTRAMUSCULAR | Status: DC | PRN

## 2020-08-05 MED ORDER — SODIUM CHLORIDE 0.9 % IV BOLUS
1000.0000 mL | Freq: Once | INTRAVENOUS | Status: AC
  Administered 2020-08-05: 1000 mL via INTRAVENOUS

## 2020-08-05 MED ORDER — IOHEXOL 300 MG/ML  SOLN
100.0000 mL | Freq: Once | INTRAMUSCULAR | Status: AC | PRN
  Administered 2020-08-05: 100 mL via INTRAVENOUS

## 2020-08-05 MED ORDER — VANCOMYCIN HCL 1250 MG/250ML IV SOLN
1250.0000 mg | Freq: Two times a day (BID) | INTRAVENOUS | Status: DC
  Administered 2020-08-06: 1250 mg via INTRAVENOUS
  Filled 2020-08-05: qty 250

## 2020-08-05 MED ORDER — LACTATED RINGERS IV BOLUS
1000.0000 mL | Freq: Once | INTRAVENOUS | Status: AC
  Administered 2020-08-05: 1000 mL via INTRAVENOUS

## 2020-08-05 MED ORDER — SODIUM CHLORIDE 0.9% IV SOLUTION: Freq: Once | INTRAVENOUS | Status: AC

## 2020-08-05 MED ORDER — ONDANSETRON HCL 4 MG PO TABS: 4.0000 mg | ORAL_TABLET | Freq: Four times a day (QID) | ORAL | Status: DC | PRN

## 2020-08-05 MED ORDER — LACTULOSE ENEMA
300.0000 mL | Freq: Once | ORAL | Status: AC
  Administered 2020-08-05: 300 mL via RECTAL
  Filled 2020-08-05: qty 300

## 2020-08-05 MED ORDER — SODIUM CHLORIDE 0.9 % IV SOLN: INTRAVENOUS | Status: DC

## 2020-08-05 MED ORDER — VANCOMYCIN HCL 1500 MG/300ML IV SOLN
1500.0000 mg | Freq: Once | INTRAVENOUS | Status: AC
  Administered 2020-08-05: 1500 mg via INTRAVENOUS
  Filled 2020-08-05: qty 300

## 2020-08-05 MED ORDER — LORAZEPAM 2 MG/ML IJ SOLN
0.5000 mg | Freq: Once | INTRAMUSCULAR | Status: AC
  Administered 2020-08-05: 0.5 mg via INTRAVENOUS
  Filled 2020-08-05: qty 1

## 2020-08-05 MED ORDER — DEXAMETHASONE SODIUM PHOSPHATE 4 MG/ML IJ SOLN: 2.0000 mg | Freq: Every day | INTRAMUSCULAR | Status: DC

## 2020-08-05 NOTE — ED Triage Notes (Signed)
From home. Hx of cancer. Recently had fluid buildup in abdomen and receiving frequent paracentesis for this. Increased SOB tonight and abdominal distention. Thinks he needs a paracentesis

## 2020-08-05 NOTE — Progress Notes (Addendum)
Lactic acid 6.1. AMION page sent to Dr. Marylyn Ishihara. See new orders. Will continue to monitor.

## 2020-08-05 NOTE — Progress Notes (Signed)
RN called blood bank, blood not yet ready.

## 2020-08-05 NOTE — Progress Notes (Signed)
Patient with dropping hgb. Check hemoccult. Transfuse 1 unit pRBCs. q6h H&H. His vitals are actually stable. He is, mentation-wise, about the same as when I saw him this morning. His ammonia is improving. Continue lactulose enemas. If he declines, move to progressive, formally consult PCCM.

## 2020-08-05 NOTE — Significant Event (Incomplete)
Rapid Response Event Note   Reason for Call :  Hypotensive after starting PRBC  Initial Focused Assessment:  Patient is lethargic, responds to painful stimuli, See VSS, hypotensive, hypoglycemic,   Interventions:  Notified [place] NP 500 mL NS Bolus per standing orders.   Plan of Care:  500 mL Bolus per standing orders.  Benadryl 50 mg IV Solumedrol 40 IV  Epi 0.3 mg IM Transfer to a higher level of care.    Event Summary: Transferred to higher level of care, NP spoke with family patient is now a DNR, will continue to monitor closely    MD Notified:  Kennon Holter, NP  Call Time: [place] Time:  End Time:  2335  [place] May, RN

## 2020-08-05 NOTE — Progress Notes (Signed)
CRITICAL VALUE STICKER  CRITICAL VALUE: hemoglobin 6.6  RECEIVER (on-site recipient of call): Garlon Hatchet, LPN  DATE & TIME NOTIFIED: 07/30/2020 at 1630  MESSENGER (representative from lab): Ethlyn Gallery   MD NOTIFIED: Dr. Marylyn Ishihara  TIME OF NOTIFICATION: 2984  RESPONSE: call / repeat lab

## 2020-08-05 NOTE — Progress Notes (Addendum)
AMION page sent to Dr. Marylyn Ishihara for lactic acid of 4.5. 500 mL bolus ordered per Dr. Marylyn Ishihara. Will continue to monitor.

## 2020-08-05 NOTE — Progress Notes (Signed)
VS monitored by RN after Blood transfusion initiation. RN noted patients blood pressure lower and breathing more rapid and deep. Charge nurse [place] notified, Blood transfusion discontinued. Rapid nurse and Jeannette Corpus notified.    08/01/2020 2222 08/03/2020 2227  Vitals  Temp  --  98 F (36.7 C)  Temp Source  --  Axillary  BP (!) 74/51 (!) 81/45  MAP (mmHg)  --  (!) 56  BP Location Left Arm  --   BP Method Automatic Automatic  Patient Position (if appropriate) Lying  --   Pulse Rate  --  (!) 40  Pulse Rate Source  --  Monitor  Resp  --  (!) 38  Level of Consciousness  Level of Consciousness  --  Responds to Pain  MEWS COLOR  MEWS Score Color Red Red  Oxygen Therapy  SpO2  --  95 %  O2 Device  --  Room [employer]

## 2020-08-05 NOTE — Progress Notes (Addendum)
AMION page sent to Dr. Marylyn Ishihara to inform him that patient has not had a BM this shift after receiving lactulose enema. Will continue to monitor.

## 2020-08-05 NOTE — Progress Notes (Signed)
CRITICAL VALUE STICKER  CRITICAL VALUE: hemoglobin 6.3  RECEIVER (on-site recipient of call): viewed in results   DATE & TIME NOTIFIED: 07/19/2020 1843  MESSENGER (representative from lab): n/a  MD NOTIFIED: Dr. Marylyn Ishihara by [facility] page   TIME OF NOTIFICATION: 07/31/2020 1843  RESPONSE: called and stated that he will be ordering type and screen, and 1 unit of PRBC and check FOBT.

## 2020-08-05 NOTE — Progress Notes (Signed)
Report given to receiving RN Leafy Ro.  Blood bank notified of the possible blood transfusion reaction, blood sent back to blood bank.

## 2020-08-05 NOTE — Progress Notes (Signed)
Pharmacy Antibiotic Note  Darius Fox is a 50 y.o. male with metastatic uveal melanoma presented to the ED on 08/11/2020 with c/o abdominal pain and AMS. He was started on ceftriaxone and azithromycin on admission for PNA. Pharmacy has been consulted to add vancomycin to abx regimen.  Plan: - vancomycin 1500 mg IV x1, then 1250mg  IV q12h for est AUC 460 - ceftriaxone 2gm IV q24h and azithromycin 500mg  daily per MD ___________________________________________   Height: 5\' 11"  (180.3 cm) Weight: 75.3 kg (166 lb 0.1 oz) IBW/kg (Calculated) : 75.3  Temp (24hrs), Avg:98.4 F (36.9 C), Min:97.9 F (36.6 C), Max:99.1 F (37.3 C)  Recent Labs  Lab 07/28/2020 0118 07/25/2020 0550 07/22/2020 1053  WBC 10.6*  --   --   CREATININE 0.37*  --   --   LATICACIDVEN 4.9* 4.3* 4.5*    Estimated Creatinine Clearance: 116.3 mL/min (A) (by C-G formula based on SCr of 0.37 mg/dL (L)).    No Known Allergies   Thank you for allowing pharmacy to be a part of this patient's care.  Lynelle Doctor 08/11/2020 3:10 PM

## 2020-08-05 NOTE — ED Provider Notes (Signed)
[facility] DEPT Provider Note   CSN: [id number] Arrival date & time: 08/01/2020  0012     History Chief Complaint  Patient presents with  . Abdominal Pain  . Shortness of Breath  . Fatigue    LEVEL 5 CAVEAT 2/2 AMS/CONFUSION  Darius Fox is a 50 y.o. male.  50 y/o male with hx of metastatic uveal melanoma presents to the ED for evaluation.  The patient is intermittently complaining of abdominal pain.  It is difficult to ascertain a history given that he appears acutely confused.  He is unable to describe what makes his symptoms better or worse.  He will intermittently reach for my arm and verbalized, "help me".  Adjunct history was provided by wife and oncologist.  They report that the patient has been more acutely confused tonight prompting call to EMS for transport.  The history is provided by the spouse, the EMS personnel and medical records.  Abdominal Pain Associated symptoms: shortness of breath   Shortness of Breath Associated symptoms: abdominal pain        Past Medical History:  Diagnosis Date  . Cancer ([place]) 2009   eye - right (below retina)  . GERD (gastroesophageal reflux disease)   . Mass    chest wall    Patient Active Problem List   Diagnosis Date Noted  . Hepatic encephalopathy ([place]) 08/01/2020  . Liver metastasis ([place]) 10/01/2015  . Vasovagal near syncope 09/18/2015  . Mass of right lobe of liver 09/17/2015  . 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 Epidermoid cyst of skin of chest 04/11/2014    Past Surgical History:  Procedure Laterality Date  . EYE SURGERY  2009   due to cancer...  . EYE SURGERY     09/20/15 PER DR Carlis Abbott PT CAN ONLY BE SCANNED ON 1.5 TESLA  . INGUINAL HERNIA REPAIR Right 1994  . MASS EXCISION  08/29/2011   Procedure: MINOR EXCISION OF MASS;  Surgeon: Adin Hector, MD;  Location: [facility];  Service: General;  Laterality: Left;  local excision of left chest wall mass    . MASS EXCISION N/A 04/11/2014   Procedure: MINOR EXCISION OF EPIDERMOID CYSTS FROM BACK X2;  Surgeon: Fanny Skates, MD;  Location: [place];  Service: General;  Laterality: N/A;       Family History  Problem Relation Age of Onset  . Diabetes Mother   . Hypertension Father   . Prostate cancer Paternal Grandfather   . Colon cancer Neg Hx   . Liver cancer Neg Hx   . Pancreatic cancer Neg Hx   . Rectal cancer Neg Hx   . Stomach cancer Neg Hx     Social History   Tobacco Use  . Smoking status: Never Smoker  . Smokeless tobacco: Never Used  Substance Use Topics  . Alcohol use: Yes    Comment: occasional 2 -3 per week  . Drug use: No    Home Medications Prior to Admission medications   Medication Sig Start Date End Date Taking? Authorizing Provider  Cholecalciferol (D3-1000 PO) Take 2,000 mg by mouth.    [provider]  dexamethasone (DECADRON) 2 MG tablet Take 2 mg by mouth every morning. 07/18/20   [provider]  LORazepam (ATIVAN) 0.5 MG tablet Take 0.5 mg by mouth 3 (three) times daily as needed. 04/04/19   [provider]  LORazepam (ATIVAN) 1 MG tablet Take 1 tablet (1 mg total) by mouth every 8 (  eight) hours. For anxiety Patient not taking: Reported on 04/30/2019 10/01/15   Brunetta Genera, MD  morphine (MS CONTIN) 15 MG 12 hr tablet Take 15 mg by mouth 2 (two) times daily as needed. 08/02/20   [provider]  vitamin B-12 (CYANOCOBALAMIN) 500 MCG tablet Take 500 mcg by mouth daily.    [provider]    Allergies    Patient has no known allergies.  Review of Systems   Review of Systems  Unable to perform ROS: Mental status change  Respiratory: Positive for shortness of breath.   Gastrointestinal: Positive for abdominal pain.    Physical Exam Updated Vital Signs BP 132/84   Pulse (!) 112   Temp 98.1 F (36.7 C) (Axillary)   Resp 15   Ht '5\' 11"'  (1.803 m)   Wt 75.3 kg   SpO2 98%   BMI  23.15 kg/m   Physical Exam Vitals and nursing note reviewed.  Constitutional:      Appearance: He is well-nourished. He is ill-appearing. He is not diaphoretic.     Comments: Chronically ill-appearing, cachectic  HENT:     Head: Normocephalic and atraumatic.  Eyes:     General: No scleral icterus.    Extraocular Movements: EOM normal.     Conjunctiva/sclera: Conjunctivae normal.     Comments: Left pupil 79m, reactive. R pupil 252m sluggish.  Cardiovascular:     Rate and Rhythm: Regular rhythm. Tachycardia present.     Pulses: Normal pulses.  Pulmonary:     Effort: Pulmonary effort is normal. No respiratory distress.     Breath sounds: No stridor. No rhonchi.     Comments: Lungs CTAB. Chest expansion symmetric. Abdominal:     Comments: Distended, firm abdomen with umbilical hernia. No appreciable focal TTP.  Musculoskeletal:        General: Normal range of motion.     Cervical back: Normal range of motion.  Skin:    General: Skin is warm and dry.     Coloration: Skin is not pale.     Findings: No erythema or rash.  Neurological:     Mental Status: He is alert.     GCS: GCS eye subscore is 4. GCS verbal subscore is 4. GCS motor subscore is 5.     Comments: Moving extremities spontaneously.  Psychiatric:        Mood and Affect: Mood and affect normal.        Speech: Speech is delayed.        Behavior: Behavior is slowed.     ED Results / Procedures / Treatments   Labs (all labs ordered are listed, but only abnormal results are displayed) Labs Reviewed  CBC WITH DIFFERENTIAL/PLATELET - Abnormal; Notable for the following components:      Result Value   WBC 10.6 (*)    RBC 3.58 (*)    Hemoglobin 9.7 (*)    HCT 30.5 (*)    RDW 22.1 (*)    nRBC 1.2 (*)    Neutro Abs 8.4 (*)    Abs Immature Granulocytes 0.75 (*)    All other components within normal limits  COMPREHENSIVE METABOLIC PANEL - Abnormal; Notable for the following components:   Sodium 129 (*)    Chloride  94 (*)    Glucose, Bld 122 (*)    BUN 33 (*)    Creatinine, Ser 0.37 (*)    Calcium 8.1 (*)    Total Protein 5.7 (*)    Albumin 2.3 (*)  AST 233 (*)    ALT 178 (*)    Alkaline Phosphatase 782 (*)    Total Bilirubin 2.6 (*)    All other components within normal limits  PROTIME-INR - Abnormal; Notable for the following components:   Prothrombin Time 15.5 (*)    INR 1.3 (*)    All other components within normal limits  AMMONIA - Abnormal; Notable for the following components:   Ammonia 150 (*)    All other components within normal limits  LACTIC ACID, PLASMA - Abnormal; Notable for the following components:   Lactic Acid, Venous 4.9 (*)    All other components within normal limits  CULTURE, BLOOD (ROUTINE X 2)  CULTURE, BLOOD (ROUTINE X 2)  SARS CORONAVIRUS 2 (TAT 6-24 HRS)  APTT  MAGNESIUM  LIPASE, BLOOD  URINALYSIS, ROUTINE W REFLEX MICROSCOPIC    EKG None  Radiology CT Head Wo Contrast  Result Date: 08/11/2020 CLINICAL DATA:  Delirium, history of malignancy, recent abdominal fluid distention requiring paracentesis EXAM: CT HEAD WITHOUT CONTRAST TECHNIQUE: Contiguous axial images were obtained from the base of the skull through the vertex without intravenous contrast. COMPARISON:  MRI 09/21/2015, orbital radiograph 09/20/2015 FINDINGS: Brain: No evidence of acute infarction, hemorrhage, hydrocephalus, extra-axial collection, visible mass lesion or mass effect. Vascular: Atherosclerotic calcification of the carotid siphons. No hyperdense vessel. Skull: No calvarial fracture or suspicious osseous lesion. No scalp swelling or hematoma. Sinuses/Orbits: Metallic radiodensities in the right orbit, situated along the right globe, correlate with previously identified fiducials. Orbits are otherwise grossly unremarkable within the limitations this unenhanced CT. Minimal mural thickening in the ethmoids. Paranasal sinuses and mastoid air cells are otherwise predominantly clear. Other:  None. IMPRESSION: 1. No acute intracranial findings. Electronically Signed   By: Lovena Le M.D.   On: 07/31/2020 02:57   CT ABDOMEN PELVIS W CONTRAST  Result Date: 07/24/2020 CLINICAL DATA:  Abdominal distension, history of malignancy EXAM: CT ABDOMEN AND PELVIS WITH CONTRAST TECHNIQUE: Multidetector CT imaging of the abdomen and pelvis was performed using the standard protocol following bolus administration of intravenous contrast. CONTRAST:  131m OMNIPAQUE IOHEXOL 300 MG/ML  SOLN COMPARISON:  CT 04/30/2019 FINDINGS: Lower chest: Numerous new pulmonary nodules present throughout both lungs. Additionally, there are patchy areas of mixed ground-glass and consolidative opacity in both lungs and bilateral pleural effusions. Metastatic adenopathy noted in the mediastinum hilum. Direct extension the hepatic lesions into the anterior mediastinum/pre cardiac space. Hepatobiliary: Postsurgical changes from prior partial right hepatectomy. Significant interval progression of disease with numerous new and enlarging heterogeneous metastatic lesions throughout both the right and left lobe liver, largest lesion in the right lobe measuring up to 8.3 cm. Large conglomerate of metastatic foci in the left lobe measures up to 14 cm in maximum transaxial dimension. Numerous additional smaller metastatic lesions seen elsewhere in the liver including foci within the caudate as well. Likely resulting mass effect upon both portal and hepatic veins. Partial effacement of the IVC is well. Gallbladder appears surgically absent. Pancreas: No pancreatic ductal dilatation or surrounding inflammatory changes. Spleen: No clear splenic lesions are seen. Multiple adjacent metastatic deposits are seen in the left subphrenic space however. Adrenals/Urinary Tract: Large bilateral adrenal metastatic deposits are present measuring up to 7.8 cm on the left and 5.7 cm on the right. Kidneys are normally located with symmetric enhancement. No  suspicious renal lesion, urolithiasis or hydronephrosis. Urinary bladder is unremarkable. Stomach/Bowel: Mass effect of the numerous upper abdominal metastatic foci resulting in some probable compression of the gastric  antrum. Distal stomach and duodenum are unremarkable. No small bowel thickening or dilatation. Large colonic stool burden. Appendix is not visualized. Vascular/Lymphatic: Compression of the hepatic, portal veins and supra hepatic IVC. Numerous metastatic deposits noted throughout the abdomen pelvis including mesenteric and omental nodularity. Reproductive: The prostate and seminal vesicles are unremarkable. Other: Large volume abdominopelvic ascites. Numerous metastatic deposits within the omentum and mesentery, largest omental deposits seen in the left upper quadrant/subphrenic space. Additional features of anasarca with diffuse circumferential body wall edema. Musculoskeletal: Expansile, lytic appearing changes of the sternum and xiphoid process. Adjacent soft tissue extension along the anterior chest wall. Necrotic internal mammary adenopathy is noted as well. Stable sclerotic/lytic lesion seen in the right ilium. IMPRESSION: 1. Marked interval progression of disease with numerous new and enlarging heterogeneous metastatic lesions throughout both the right and left lobe liver. Likely resulting mass effect upon the hepatic, portal veins and supra hepatic IVC. Partial extension into the anterior mediastinum. 2. Numerous additional new metastatic deposits within the omentum and mesentery, largest omental deposits in the left upper quadrant/subphrenic space. Large volume likely malignant ascites. 3. New pulmonary and adrenal metastatic foci. Osseous metastatic involvement of the sternum and xiphoid process significant soft tissue extension into the anterior chest wall. Necrotic nodal metastatic disease throughout the chest and abdomen. 4. Bilateral pleural effusions. 5. Patchy areas of mixed  ground-glass and consolidative opacity in both lungs and bilateral pleural effusions, could reflect a superimposed infectious process as well. 6. Additional features of anasarca with diffuse circumferential body wall edema. Electronically Signed   By: Lovena Le M.D.   On: 07/30/2020 03:10   DG Chest Port 1 View  Result Date: 08/10/2020 CLINICAL DATA:  Shortness of breath and abdominal distension. EXAM: PORTABLE CHEST 1 VIEW COMPARISON:  August 12, 2011 FINDINGS: Decreased lung volumes are seen which is likely, in part, secondary to suboptimal patient inspiration. Very mild linear atelectasis is seen within the left lung base. There is a small right pleural effusion. No pneumothorax is identified. The heart size and mediastinal contours are within normal limits. The visualized skeletal structures are unremarkable. IMPRESSION: 1. Mild left basilar linear atelectasis. 2. Small right pleural effusion. Electronically Signed   By: Virgina Norfolk M.D.   On: 08/03/2020 01:39    Procedures Procedures   Medications Ordered in ED Medications  lactulose (CHRONULAC) 10 GM/15ML solution 20 g (has no administration in time range)  fentaNYL (SUBLIMAZE) injection 50 mcg (50 mcg Intravenous Given 07/17/2020 0111)  ondansetron (ZOFRAN) injection 4 mg (4 mg Intravenous Given 08/11/2020 0111)  lactated ringers bolus 1,000 mL (1,000 mLs Intravenous New Bag/Given 07/31/2020 0242)  iohexol (OMNIPAQUE) 300 MG/ML solution 100 mL (100 mLs Intravenous Contrast Given 07/19/2020 0228)    ED Course  I have reviewed the triage vital signs and the nursing notes.  Pertinent labs & imaging results that were available during my care of the patient were reviewed by me and considered in my medical decision making (see chart for details).  Clinical Course as of 08/11/2020 1017  Sun Aug 05, 2020  0045 Received call from patient's oncologist in [place], [place] - Dr. Dellis Anes.  Reports that patient is on a clinical trial  medication which he receives orally, daily.  They have been trending his labs in the office without significant change to his LFTs.  He does have history of ascites with scheduled paracentesis in 2 days.  Today he became more acutely confused tonight; walked into a closet instead of his bedroom.  Is supposed to be due for upcoming scans for repeat staging of his metastatic uveal melanoma.  Metastases have been extensive in the liver and abdomen. [KH]  0119 Spoke with patient's wife who states that patient began to complain of left-sided abdominal pain 4 days ago.  This prompted the scheduling of his paracentesis for 08/07/2020.  She states that ascites has seemed subjectively stable; wife feels that degree of ascites has actually been worse in the past.  Patient began to act acutely confused around 7/8pm tonight.  He was recently transitioned from oxycodone to long-acting morphine twice daily, but has not taken this medication more than 24 hours. [KH]  0209 Labs reviewed. Lactate elevated at 4.6. He has hemoglobin of 9.7 which appears to be down from a baseline around 12 for the past 2 months. [KH]  0211 LFTs are elevated. Compared to 08/01/20, AST 100>233, ALT 118>178, Alk phos 597>782, Tbili 2.1>2.6. PT/INR fairly stable.  Ammonia 150 which may be contributing to AMS. [KH]  0302 CT head negative for evidence of intracranial metastasis or other acute process. [KH]  [phone] Spoke with wife about patient's results and plan for admission.  She verbalizes understanding.  She is comfortable with admission to [facility].  Does not feel it necessary to discuss admission at [facility] as he is primarily followed by oncology in [place], [place] at this time. [KH]  0520 Continues to spit out oral lactulose; order changed to enema. [KH]    Clinical Course User Index [KH] Antonietta Breach, PA-C   MDM Rules/Calculators/A&P                          50 year old male with a history of metastatic uveal melanoma, associated  carcinomatosis of the abdomen, presents to the emergency department for altered mental status.  He is presently receiving oncologic care in [place], [place] where he is enrolled in a clinical trial. Takes his oral medication PO daily. Is prescribed long acting morphine for pain management, but has not taken this in >24 hours.  Wife began to notice changes in mentation around 1900 tonight.  When it was suggested that patient go lie down in the bedroom, he instead walked into the closet and sat on the floor.  The patient presented to the ED acutely confused with inability to ascertain history, complaining primarily of abdominal discomfort; however, wife states that this is a fairly chronic complaint associated with his ascites.  No fevers to suggest SBP. His abdominal exam is not peritonitic.   The patient is tachycardic, but otherwise hemodynamically stable.  Hydrated with IVF. Work-up today notable for ammonia of 150.  This is likely related to his liver metastases.  LFTs elevated, but relatively c/w prior.  Hepatic encephalopathy consistent with complaints of acute confusion.  He has no obvious focal neurologic deficits and head CT without present concern for intracranial abnormality/metastasis.  Started on lactulose pending hospitalist assessment for admission.  Will also order consult for palliative care to be completed while inpatient.   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

## 2020-08-05 NOTE — ED Notes (Signed)
MD aware of pt spitting out lactulose.

## 2020-08-05 NOTE — Progress Notes (Signed)
MRSA PCR positive. AMION page sent to Dr. Marylyn Ishihara. Will continue to monitor.

## 2020-08-05 NOTE — Progress Notes (Signed)
   07/26/2020 0659  Assess: MEWS Score  Temp 97.9 F (36.6 C)  BP 96/61  Pulse Rate (!) 115  Resp 15  SpO2 97 %  Assess: MEWS Score  MEWS Temp 0  MEWS Systolic 1  MEWS Pulse 2  MEWS RR 0  MEWS LOC 1  MEWS Score 4  MEWS Score Color Red  Assess: if the MEWS score is Yellow or Red  Were vital signs taken at a resting state? Yes  Focused Assessment Change from prior assessment (see assessment flowsheet)  Early Detection of Sepsis Score *See Row Information* Low  MEWS guidelines implemented *See Row Information* Yes  Treat  MEWS Interventions Escalated (See documentation below);Other (Comment) Hydrographic surveyor made aware)  Pain Scale FLACC  Pain Type Chronic pain  Pain Location Abdomen  Take Vital Signs  Increase Vital Sign Frequency  Red: Q 1hr X 4 then Q 4hr X 4, if remains red, continue Q 4hrs  Escalate  MEWS: Escalate Red: discuss with charge nurse/RN and provider, consider discussing with RRT  Notify: Charge Nurse/RN  Name of Charge Nurse/RN Notified Christa, RN  Date Charge Nurse/RN Notified 08/09/2020  Time Charge Nurse/RN Notified [phone]  Document  Patient Outcome Other (Comment) (Continue  monitoring)

## 2020-08-05 NOTE — H&P (Signed)
History and Physical    Darius Fox ZOX:[id number] DOB: [birth year] DOA: 07/29/2020  PCP: Royetta Asal, MD  Patient coming from: Home  Chief Complaint: altered mental status  HPI: Darius Fox is a 50 y.o. male with medical history significant of metastatic uveal melanoma. Presenting with altered mental status. History per chart review and wife as patient is altered. Per his wife, thepatient was lethargic all day yesterday but coherent. He mostly slept and sipped water throughout the day. Late in the evening he was able to get into bed, but he acted like he was gasping for air. He would yell, "help me!" He then acted that he was going to the bathroom, but moved into the closet and sat there instead. He seemed greatly confused. His wife called his oncologist who recommended that he go to the ED. She states that he had some abdominal pain several days before and was scheduled for a paracentesis. But she did not note any difficulties yesterday. She denies any other aggravating or alleviating factors.   ED Course: Found to have elevated LFTs and ammonia. They spoke with his trial clinician. He was started on lactulose. TRH was called for admission.   Review of Systems: Unable to obtain d/t mentation PMHx Past Medical History:  Diagnosis Date  . Cancer ([place]) 2009   eye - right (below retina)  . GERD (gastroesophageal reflux disease)   . Mass    chest wall    PSHx Past Surgical History:  Procedure Laterality Date  . EYE SURGERY  2009   due to cancer...  . EYE SURGERY     09/20/15 PER DR Carlis Abbott PT CAN ONLY BE SCANNED ON 1.5 TESLA  . INGUINAL HERNIA REPAIR Right 1994  . MASS EXCISION  08/29/2011   Procedure: MINOR EXCISION OF MASS;  Surgeon: Adin Hector, MD;  Location: [facility];  Service: General;  Laterality: Left;  local excision of left chest wall mass   . MASS EXCISION N/A 04/11/2014   Procedure: MINOR EXCISION OF EPIDERMOID CYSTS FROM BACK X2;  Surgeon: Fanny Skates, MD;  Location: [facility];  Service: General;  Laterality: N/A;    SocHx  reports that he has never smoked. He has never used smokeless tobacco. He reports current alcohol use. He reports that he does not use drugs.  No Known Allergies  FamHx Family History  Problem Relation Age of Onset  . Diabetes Mother   . Hypertension Father   . Prostate cancer Paternal Grandfather   . Colon cancer Neg Hx   . Liver cancer Neg Hx   . Pancreatic cancer Neg Hx   . Rectal cancer Neg Hx   . Stomach cancer Neg Hx     Prior to Admission medications   Medication Sig Start Date End Date Taking? Authorizing Provider  Cholecalciferol (D3-1000 PO) Take 2,000 mg by mouth.    [provider]  dexamethasone (DECADRON) 2 MG tablet Take 2 mg by mouth every morning. 07/18/20   [provider]  LORazepam (ATIVAN) 0.5 MG tablet Take 0.5 mg by mouth 3 (three) times daily as needed. 04/04/19   [provider]  LORazepam (ATIVAN) 1 MG tablet Take 1 tablet (1 mg total) by mouth every 8 (eight) hours. For anxiety Patient not taking: Reported on 04/30/2019 10/01/15   Brunetta Genera, MD  morphine (MS CONTIN) 15 MG 12 hr tablet Take 15 mg by mouth 2 (two) times daily as needed. 08/02/20   [provider]  vitamin B-12 (CYANOCOBALAMIN) 500 MCG tablet Take 500 mcg by mouth daily.    [provider]    Physical Exam: Vitals:   08/11/2020 0515 08/12/2020 0530 07/19/2020 0545 08/12/2020 0659  BP: 110/74 108/68 113/69 96/61  Pulse: (!) 114 (!) 113 (!) 112 (!) 115  Resp: (!) 22 18 15 15   Temp:    97.9 F (36.6 C)  TempSrc:    Oral  SpO2: 100% 99% 99% 97%  Weight:      Height:        General: 50 y.o. chronically ill appearing male resting in bed Eyes: PERRL, normal sclera ENMT: Nares patent w/o discharge, orophaynx clear, dentition normal, ears w/o discharge/lesions/ulcers Neck: think, trachea midline Cardiovascular: tachy, +S1, S2, no m/g/r, equal  pulses throughout Respiratory: course, UAT, normal WOB on RA GI: BS hypoactive, distended but soft, NT, no organomegaly noted MSK: No c/c, trace b/l pedal edema Neuro: A&O x name/place only, no focal deficits but he follows limited commands Psyc: lethargic  Labs on Admission: I have personally reviewed following labs and imaging studies  CBC: Recent Labs  Lab 08/13/2020 0118  WBC 10.6*  NEUTROABS 8.4*  HGB 9.7*  HCT 30.5*  MCV 85.2  PLT 921   Basic Metabolic Panel: Recent Labs  Lab 08/01/2020 0118  NA 129*  K 4.3  CL 94*  CO2 23  GLUCOSE 122*  BUN 33*  CREATININE 0.37*  CALCIUM 8.1*  MG 1.9   GFR: Estimated Creatinine Clearance: 116.3 mL/min (A) (by C-G formula based on SCr of 0.37 mg/dL (L)). Liver Function Tests: Recent Labs  Lab 07/22/2020 0118  AST 233*  ALT 178*  ALKPHOS 782*  BILITOT 2.6*  PROT 5.7*  ALBUMIN 2.3*   Recent Labs  Lab 07/20/2020 0209  LIPASE 29   Recent Labs  Lab 08/07/2020 0118  AMMONIA 150*   Coagulation Profile: Recent Labs  Lab 07/23/2020 0118  INR 1.3*   Cardiac Enzymes: No results for input(s): CKTOTAL, CKMB, CKMBINDEX, TROPONINI in the last 168 hours. BNP (last 3 results) No results for input(s): PROBNP in the last 8760 hours. HbA1C: No results for input(s): HGBA1C in the last 72 hours. CBG: No results for input(s): GLUCAP in the last 168 hours. Lipid Profile: No results for input(s): CHOL, HDL, LDLCALC, TRIG, CHOLHDL, LDLDIRECT in the last 72 hours. Thyroid Function Tests: No results for input(s): TSH, T4TOTAL, FREET4, T3FREE, THYROIDAB in the last 72 hours. Anemia Panel: No results for input(s): VITAMINB12, FOLATE, FERRITIN, TIBC, IRON, RETICCTPCT in the last 72 hours. Urine analysis:    Component Value Date/Time   COLORURINE YELLOW 08/11/2020 0503   APPEARANCEUR CLEAR 07/20/2020 0503   LABSPEC 1.036 (H) 07/20/2020 0503   PHURINE 6.0 07/25/2020 0503   GLUCOSEU NEGATIVE 08/13/2020 0503   HGBUR NEGATIVE 07/24/2020  0503   BILIRUBINUR NEGATIVE 08/04/2020 0503   KETONESUR 5 (A) 07/30/2020 0503   PROTEINUR NEGATIVE 08/07/2020 0503   NITRITE NEGATIVE 07/17/2020 0503   LEUKOCYTESUR NEGATIVE 08/09/2020 0503    Radiological Exams on Admission: CT Head Wo Contrast  Result Date: 08/08/2020 CLINICAL DATA:  Delirium, history of malignancy, recent abdominal fluid distention requiring paracentesis EXAM: CT HEAD WITHOUT CONTRAST TECHNIQUE: Contiguous axial images were obtained from the base of the skull through the vertex without intravenous contrast. COMPARISON:  MRI 09/21/2015, orbital radiograph 09/20/2015 FINDINGS: Brain: No evidence of acute infarction, hemorrhage, hydrocephalus, extra-axial collection, visible mass lesion or mass effect. Vascular: Atherosclerotic calcification of the carotid siphons. No hyperdense vessel. Skull: No  calvarial fracture or suspicious osseous lesion. No scalp swelling or hematoma. Sinuses/Orbits: Metallic radiodensities in the right orbit, situated along the right globe, correlate with previously identified fiducials. Orbits are otherwise grossly unremarkable within the limitations this unenhanced CT. Minimal mural thickening in the ethmoids. Paranasal sinuses and mastoid air cells are otherwise predominantly clear. Other: None. IMPRESSION: 1. No acute intracranial findings. Electronically Signed   By: Lovena Le M.D.   On: 07/25/2020 02:57   CT ABDOMEN PELVIS W CONTRAST  Result Date: 07/17/2020 CLINICAL DATA:  Abdominal distension, history of malignancy EXAM: CT ABDOMEN AND PELVIS WITH CONTRAST TECHNIQUE: Multidetector CT imaging of the abdomen and pelvis was performed using the standard protocol following bolus administration of intravenous contrast. CONTRAST:  175mL OMNIPAQUE IOHEXOL 300 MG/ML  SOLN COMPARISON:  CT 04/30/2019 FINDINGS: Lower chest: Numerous new pulmonary nodules present throughout both lungs. Additionally, there are patchy areas of mixed ground-glass and consolidative  opacity in both lungs and bilateral pleural effusions. Metastatic adenopathy noted in the mediastinum hilum. Direct extension the hepatic lesions into the anterior mediastinum/pre cardiac space. Hepatobiliary: Postsurgical changes from prior partial right hepatectomy. Significant interval progression of disease with numerous new and enlarging heterogeneous metastatic lesions throughout both the right and left lobe liver, largest lesion in the right lobe measuring up to 8.3 cm. Large conglomerate of metastatic foci in the left lobe measures up to 14 cm in maximum transaxial dimension. Numerous additional smaller metastatic lesions seen elsewhere in the liver including foci within the caudate as well. Likely resulting mass effect upon both portal and hepatic veins. Partial effacement of the IVC is well. Gallbladder appears surgically absent. Pancreas: No pancreatic ductal dilatation or surrounding inflammatory changes. Spleen: No clear splenic lesions are seen. Multiple adjacent metastatic deposits are seen in the left subphrenic space however. Adrenals/Urinary Tract: Large bilateral adrenal metastatic deposits are present measuring up to 7.8 cm on the left and 5.7 cm on the right. Kidneys are normally located with symmetric enhancement. No suspicious renal lesion, urolithiasis or hydronephrosis. Urinary bladder is unremarkable. Stomach/Bowel: Mass effect of the numerous upper abdominal metastatic foci resulting in some probable compression of the gastric antrum. Distal stomach and duodenum are unremarkable. No small bowel thickening or dilatation. Large colonic stool burden. Appendix is not visualized. Vascular/Lymphatic: Compression of the hepatic, portal veins and supra hepatic IVC. Numerous metastatic deposits noted throughout the abdomen pelvis including mesenteric and omental nodularity. Reproductive: The prostate and seminal vesicles are unremarkable. Other: Large volume abdominopelvic ascites. Numerous  metastatic deposits within the omentum and mesentery, largest omental deposits seen in the left upper quadrant/subphrenic space. Additional features of anasarca with diffuse circumferential body wall edema. Musculoskeletal: Expansile, lytic appearing changes of the sternum and xiphoid process. Adjacent soft tissue extension along the anterior chest wall. Necrotic internal mammary adenopathy is noted as well. Stable sclerotic/lytic lesion seen in the right ilium. IMPRESSION: 1. Marked interval progression of disease with numerous new and enlarging heterogeneous metastatic lesions throughout both the right and left lobe liver. Likely resulting mass effect upon the hepatic, portal veins and supra hepatic IVC. Partial extension into the anterior mediastinum. 2. Numerous additional new metastatic deposits within the omentum and mesentery, largest omental deposits in the left upper quadrant/subphrenic space. Large volume likely malignant ascites. 3. New pulmonary and adrenal metastatic foci. Osseous metastatic involvement of the sternum and xiphoid process significant soft tissue extension into the anterior chest wall. Necrotic nodal metastatic disease throughout the chest and abdomen. 4. Bilateral pleural effusions. 5. Patchy areas of  mixed ground-glass and consolidative opacity in both lungs and bilateral pleural effusions, could reflect a superimposed infectious process as well. 6. Additional features of anasarca with diffuse circumferential body wall edema. Electronically Signed   By: Lovena Le M.D.   On: 07/19/2020 03:10   DG Chest Port 1 View  Result Date: 07/20/2020 CLINICAL DATA:  Shortness of breath and abdominal distension. EXAM: PORTABLE CHEST 1 VIEW COMPARISON:  August 12, 2011 FINDINGS: Decreased lung volumes are seen which is likely, in part, secondary to suboptimal patient inspiration. Very mild linear atelectasis is seen within the left lung base. There is a small right pleural effusion. No  pneumothorax is identified. The heart size and mediastinal contours are within normal limits. The visualized skeletal structures are unremarkable. IMPRESSION: 1. Mild left basilar linear atelectasis. 2. Small right pleural effusion. Electronically Signed   By: Virgina Norfolk M.D.   On: 08/03/2020 01:39   Assessment/Plan Acute metabolic encephalopathy Hepatic encephalopathy     - admit to inpt, tele     - initially tried on PO lactulose, but he was spitting it up; switch to lactulose enemas; rpt ammonia this evening     - he has b/l PNA on CT, could also be contributing, see below  Metastatic uveal melanoma Peritoneal carcinomatosis Liver mets Pulmonary and adrenal mets Elevated LFTs     - Being followed by [facility]; but currently in a drug trial with Dr. Dellis Anes at [facility] in [place]     - wife reports last imaging was late December with that institution     - will request records  Lactic acidosis     - fluids, trend  Multifocal PNA Pleural effusions     - as seen on CT     - start broad spec abx, check MRSA swab     - COVID pending     - procal will not be useful given his current chemo status  Normocytic anemia     - no evidence of bleed, follow  Anxiety Depression     - hold xanax for now  Severe protein calorie malnutrition     - owing to advance disease     - dietary consult as he becomes more alert  Goals of Care     - currently FULL code, but wife wants to think it over     - palliative care consult  Hyponatremia     - fluids, follow  DVT prophylaxis: lovenox  Code Status: FULL  Family Communication: w/ wife by phone  Consults called: Palliative care  Status is: Inpatient  Remains inpatient appropriate because:Inpatient level of care appropriate due to severity of illness   Dispo: The patient is from: Home              Anticipated d/c is to: Home              Anticipated d/c date is: 3 days              Patient currently is not  medically stable to d/c.   Difficult to place patient No  Jonnie Finner DO [facility]  If 7PM-7AM, please contact night-coverage [email or web address]  07/29/2020, 7:26 AM

## 2020-08-05 NOTE — Progress Notes (Signed)
Consent for blood transfusion done over the phone with spouse Channing Mutters. Witnessed by Vanita Ingles, Camera operator.

## 2020-08-05 NOTE — Progress Notes (Signed)
Upon 8706 VS for red MEWS patient mental status was changed. He was no longer telling me his name or answering to my voice. Got charge nurse Abigail Butts and paged Dr. Marylyn Ishihara. Patient would only respond to pain. Dr. Marylyn Ishihara came and assessed patient. He stated that we would continue to monitor b/c he would expect this w/ hepatic encephalopathy. Will continue to monitor.

## 2020-08-05 NOTE — Progress Notes (Signed)
Sent urine sample to lab per blood reaction protocol.

## 2020-08-06 LAB — COMPREHENSIVE METABOLIC PANEL
ALT: 1239 U/L — ABNORMAL HIGH (ref 0–44)
AST: 2140 U/L — ABNORMAL HIGH (ref 15–41)
Albumin: 1.6 g/dL — ABNORMAL LOW (ref 3.5–5.0)
Alkaline Phosphatase: 581 U/L — ABNORMAL HIGH (ref 38–126)
Anion gap: 26 — ABNORMAL HIGH (ref 5–15)
BUN: 40 mg/dL — ABNORMAL HIGH (ref 6–20)
CO2: 8 mmol/L — ABNORMAL LOW (ref 22–32)
Calcium: 8.1 mg/dL — ABNORMAL LOW (ref 8.9–10.3)
Chloride: 103 mmol/L (ref 98–111)
Creatinine, Ser: 0.8 mg/dL (ref 0.61–1.24)
GFR, Estimated: >60 mL/min (ref >60)
Glucose, Bld: 62 mg/dL — ABNORMAL LOW (ref 70–99)
Potassium: 6.5 mmol/L (ref 3.5–5.1)
Sodium: 137 mmol/L (ref 135–145)
Total Bilirubin: 3.9 mg/dL — ABNORMAL HIGH (ref 0.3–1.2)
Total Protein: 4.1 g/dL — ABNORMAL LOW (ref 6.5–8.1)

## 2020-08-06 LAB — CBC WITH DIFFERENTIAL/PLATELET
Abs Immature Granulocytes: 1.19 10*3/uL — ABNORMAL HIGH (ref 0.00–0.07)
Basophils Absolute: 0 10*3/uL (ref 0.0–0.1)
Basophils Relative: 0 %
Eosinophils Absolute: 0 10*3/uL (ref 0.0–0.5)
Eosinophils Relative: 0 %
HCT: 20.5 % — ABNORMAL LOW (ref 39.0–52.0)
Hemoglobin: 5.2 g/dL — CL (ref 13.0–17.0)
Immature Granulocytes: 10 %
Lymphocytes Relative: 18 %
Lymphs Abs: 2.2 10*3/uL (ref 0.7–4.0)
MCH: 27.4 pg (ref 26.0–34.0)
MCHC: 25.4 g/dL — ABNORMAL LOW (ref 30.0–36.0)
MCV: 107.9 fL — ABNORMAL HIGH (ref 80.0–100.0)
Monocytes Absolute: 0.8 10*3/uL (ref 0.1–1.0)
Monocytes Relative: 6 %
Neutro Abs: 8.1 10*3/uL — ABNORMAL HIGH (ref 1.7–7.7)
Neutrophils Relative %: 66 %
Platelets: 206 10*3/uL (ref 150–400)
RBC: 1.9 MIL/uL — ABNORMAL LOW (ref 4.22–5.81)
RDW: 25.2 % — ABNORMAL HIGH (ref 11.5–15.5)
WBC: 12.2 10*3/uL — ABNORMAL HIGH (ref 4.0–10.5)
nRBC: 7.7 % — ABNORMAL HIGH (ref 0.0–0.2)

## 2020-08-06 LAB — URINALYSIS, COMPLETE (UACMP) WITH MICROSCOPIC
Glucose, UA: NEGATIVE mg/dL
Hgb urine dipstick: NEGATIVE
Leukocytes,Ua: NEGATIVE
Nitrite: POSITIVE — AB
Protein, ur: NEGATIVE mg/dL
RBC / HPF: NONE SEEN RBC/hpf (ref 0–5)
Specific Gravity, Urine: 1.02 (ref 1.005–1.030)
Squamous Epithelial / HPF: NONE SEEN (ref 0–5)
pH: 6 (ref 5.0–8.0)

## 2020-08-06 LAB — BPAM RBC
Blood Product Expiration Date: 202203072359
ISSUE DATE / TIME: 202202202147
Unit Type and Rh: 6200

## 2020-08-06 LAB — TYPE AND SCREEN
ABO/RH(D): A POS
Antibody Screen: NEGATIVE
Unit division: 0

## 2020-08-06 LAB — GLUCOSE, CAPILLARY
Glucose-Capillary: 119 mg/dL — ABNORMAL HIGH (ref 70–99)
Glucose-Capillary: 61 mg/dL — ABNORMAL LOW (ref 70–99)

## 2020-08-06 MED ORDER — NOREPINEPHRINE 4 MG/250ML-% IV SOLN
0.0000 ug/min | INTRAVENOUS | Status: DC
  Administered 2020-08-06: 2 ug/min via INTRAVENOUS
  Administered 2020-08-06: 34 ug/min via INTRAVENOUS
  Filled 2020-08-06 (×2): qty 250

## 2020-08-06 MED ORDER — GLYCOPYRROLATE 0.2 MG/ML IJ SOLN: 0.2000 mg | INTRAMUSCULAR | Status: DC | PRN

## 2020-08-06 MED ORDER — LACTULOSE ENEMA
300.0000 mL | Freq: Once | ORAL | Status: DC
  Filled 2020-08-06: qty 300

## 2020-08-06 MED ORDER — SODIUM CHLORIDE 0.9 % IV BOLUS
1000.0000 mL | Freq: Once | INTRAVENOUS | Status: AC
  Administered 2020-08-06: 1000 mL via INTRAVENOUS

## 2020-08-06 MED ORDER — SODIUM CHLORIDE 0.9 % IV BOLUS: 500.0000 mL | Freq: Once | INTRAVENOUS | Status: DC

## 2020-08-06 MED ORDER — MORPHINE 100MG IN NS 100ML (1MG/ML) PREMIX INFUSION
1.0000 mg/h | INTRAVENOUS | Status: DC
  Administered 2020-08-06: 1 mg/h via INTRAVENOUS
  Filled 2020-08-06: qty 100

## 2020-08-07 ENCOUNTER — Ambulatory Visit (HOSPITAL_COMMUNITY): Payer: 59

## 2020-08-07 LAB — LEGIONELLA PNEUMOPHILA SEROGP 1 UR AG: L. pneumophila Serogp 1 Ur Ag: NEGATIVE

## 2020-08-10 LAB — CULTURE, BLOOD (ROUTINE X 2)
Culture: NO GROWTH
Culture: NO GROWTH
Special Requests: ADEQUATE
Special Requests: ADEQUATE

## 2020-08-10 LAB — TRANSFUSION REACTION
DAT C3: NEGATIVE
Post RXN DAT IgG: NEGATIVE

## 2020-08-14 NOTE — Progress Notes (Signed)
Wasted remaining Morphine with Deborah Chalk RN. Wasted 13mL into Steri cycle.

## 2020-08-14 NOTE — Plan of Care (Signed)

## 2020-08-14 NOTE — Progress Notes (Signed)
Family at bedside: wife, son, mother, pastor, and pastor's wife.

## 2020-08-14 NOTE — Progress Notes (Addendum)
Received page from bedside RN. Pt has rapidly declined since 1800. Pt had reaction to PRBCs.He became unresponsive, hypotensive, bradycardiac and tachypneic. Discussed goals of care with spouse who states that she would like to make the patient DNR at this time. Pt to transfer to SDU at this time.  Blood transfusion reaction/distributive shock - Stop transfusion. Start transfusion reaction protocol. - Epi given - Benadryl and solumedrl IV ordered - Blood transfusion stopped - CBC stat - NS IV - Transfer to SDU  Discussed pt current status with wife. In agreement that we will make pt DNR for now.   Lovey Newcomer, NP [facility] [phone] [phone]

## 2020-08-14 NOTE — Progress Notes (Signed)
Called wife to provide update since transferring to ICU. Wife is home with their 52 year old child and will not likely come to bedside until morning. Will address with leadership in the morning about allowing patient's child to come see him. Wife verbalized understanding and appreciation for all care provided.

## 2020-08-14 NOTE — Progress Notes (Addendum)
Informed by Jeannette Corpus, NP that wife, child, and pastor on way to say goodbyes. Agricultural consultant and security notified to expect.

## 2020-08-14 DEATH — deceased

## 2020-09-14 NOTE — Death Summary Note (Signed)
DEATH SUMMARY   Patient Details  Name: Darius Fox MRN: [id number] DOB: 1970-07-14  Admission/Discharge Information   Admit Date:  08/24/20  Date of Death: Date of Death: 08/25/20  Time of Death: Time of Death: 0651  Length of Stay: 1  Referring Physician: Royetta Asal, MD   Reason(s) for Hospitalization  Hepatic encephalopathy  Diagnoses  Preliminary cause of death:Multifocal PNA Secondary Diagnoses (including complications and co-morbidities):  Active Problems:   Hepatic encephalopathy (HCC) Acute metabolic encephalopathy Metastatic uveal melanoma Peritoneal carcinomatosis Liver mets Pulmonary and adrenal mets Elevated LFTs Lactic acidosis Multifocal PNA Pleural effusions Normocytic anemia Anxiety Depression Severe protein calorie malnutrition Hyponatremia Blood transfusion reaction/distributive shock Brief Hospital Course (including significant findings, care, treatment, and services provided and events leading to death)  Darius Fox is a 50 y.o. year old male who presented with altered mental status. History per chart review and wife as patient is altered. Per his wife, thepatient was lethargic all day yesterday but coherent. He mostly slept and sipped water throughout the day. Late in the evening he was able to get into bed, but he acted like he was gasping for air. He would yell, "help me!" He then acted that he was going to the bathroom, but moved into the closet and sat there instead. He seemed greatly confused. His wife called his oncologist who recommended that he go to the ED. She states that he had some abdominal pain several days before and was scheduled for a paracentesis. But she did not note any difficulties yesterday. She denies any other aggravating or alleviating factors.   Patient decompensated despite abx and fluids. His Hgb dropped. A transfusion was ordered that had to be stopped d/t hypotension and tachypnea. Family decided to make him DNR at  that time. He was transferred to ICU. He was found by staff unresponsive and declared deceased with a TOD of 0651hrs 08-25-2020.  Pertinent Labs and Studies  Significant Diagnostic Studies CT Head Wo Contrast  Result Date: 08-24-2020 CLINICAL DATA:  Delirium, history of malignancy, recent abdominal fluid distention requiring paracentesis EXAM: CT HEAD WITHOUT CONTRAST TECHNIQUE: Contiguous axial images were obtained from the base of the skull through the vertex without intravenous contrast. COMPARISON:  MRI 09/21/2015, orbital radiograph 09/20/2015 FINDINGS: Brain: No evidence of acute infarction, hemorrhage, hydrocephalus, extra-axial collection, visible mass lesion or mass effect. Vascular: Atherosclerotic calcification of the carotid siphons. No hyperdense vessel. Skull: No calvarial fracture or suspicious osseous lesion. No scalp swelling or hematoma. Sinuses/Orbits: Metallic radiodensities in the right orbit, situated along the right globe, correlate with previously identified fiducials. Orbits are otherwise grossly unremarkable within the limitations this unenhanced CT. Minimal mural thickening in the ethmoids. Paranasal sinuses and mastoid air cells are otherwise predominantly clear. Other: None. IMPRESSION: 1. No acute intracranial findings. Electronically Signed   By: Lovena Le M.D.   On: 08-24-2020 02:57   CT ABDOMEN PELVIS W CONTRAST  Result Date: 2020/08/24 CLINICAL DATA:  Abdominal distension, history of malignancy EXAM: CT ABDOMEN AND PELVIS WITH CONTRAST TECHNIQUE: Multidetector CT imaging of the abdomen and pelvis was performed using the standard protocol following bolus administration of intravenous contrast. CONTRAST:  175mL OMNIPAQUE IOHEXOL 300 MG/ML  SOLN COMPARISON:  CT 04/30/2019 FINDINGS: Lower chest: Numerous new pulmonary nodules present throughout both lungs. Additionally, there are patchy areas of mixed ground-glass and consolidative opacity in both lungs and bilateral pleural  effusions. Metastatic adenopathy noted in the mediastinum hilum. Direct extension the hepatic lesions into the anterior mediastinum/pre cardiac space.  Hepatobiliary: Postsurgical changes from prior partial right hepatectomy. Significant interval progression of disease with numerous new and enlarging heterogeneous metastatic lesions throughout both the right and left lobe liver, largest lesion in the right lobe measuring up to 8.3 cm. Large conglomerate of metastatic foci in the left lobe measures up to 14 cm in maximum transaxial dimension. Numerous additional smaller metastatic lesions seen elsewhere in the liver including foci within the caudate as well. Likely resulting mass effect upon both portal and hepatic veins. Partial effacement of the IVC is well. Gallbladder appears surgically absent. Pancreas: No pancreatic ductal dilatation or surrounding inflammatory changes. Spleen: No clear splenic lesions are seen. Multiple adjacent metastatic deposits are seen in the left subphrenic space however. Adrenals/Urinary Tract: Large bilateral adrenal metastatic deposits are present measuring up to 7.8 cm on the left and 5.7 cm on the right. Kidneys are normally located with symmetric enhancement. No suspicious renal lesion, urolithiasis or hydronephrosis. Urinary bladder is unremarkable. Stomach/Bowel: Mass effect of the numerous upper abdominal metastatic foci resulting in some probable compression of the gastric antrum. Distal stomach and duodenum are unremarkable. No small bowel thickening or dilatation. Large colonic stool burden. Appendix is not visualized. Vascular/Lymphatic: Compression of the hepatic, portal veins and supra hepatic IVC. Numerous metastatic deposits noted throughout the abdomen pelvis including mesenteric and omental nodularity. Reproductive: The prostate and seminal vesicles are unremarkable. Other: Large volume abdominopelvic ascites. Numerous metastatic deposits within the omentum and  mesentery, largest omental deposits seen in the left upper quadrant/subphrenic space. Additional features of anasarca with diffuse circumferential body wall edema. Musculoskeletal: Expansile, lytic appearing changes of the sternum and xiphoid process. Adjacent soft tissue extension along the anterior chest wall. Necrotic internal mammary adenopathy is noted as well. Stable sclerotic/lytic lesion seen in the right ilium. IMPRESSION: 1. Marked interval progression of disease with numerous new and enlarging heterogeneous metastatic lesions throughout both the right and left lobe liver. Likely resulting mass effect upon the hepatic, portal veins and supra hepatic IVC. Partial extension into the anterior mediastinum. 2. Numerous additional new metastatic deposits within the omentum and mesentery, largest omental deposits in the left upper quadrant/subphrenic space. Large volume likely malignant ascites. 3. New pulmonary and adrenal metastatic foci. Osseous metastatic involvement of the sternum and xiphoid process significant soft tissue extension into the anterior chest wall. Necrotic nodal metastatic disease throughout the chest and abdomen. 4. Bilateral pleural effusions. 5. Patchy areas of mixed ground-glass and consolidative opacity in both lungs and bilateral pleural effusions, could reflect a superimposed infectious process as well. 6. Additional features of anasarca with diffuse circumferential body wall edema. Electronically Signed   By: Lovena Le M.D.   On: 07/30/2020 03:10   DG Chest Port 1 View  Result Date: 07/21/2020 CLINICAL DATA:  Shortness of breath and abdominal distension. EXAM: PORTABLE CHEST 1 VIEW COMPARISON:  August 12, 2011 FINDINGS: Decreased lung volumes are seen which is likely, in part, secondary to suboptimal patient inspiration. Very mild linear atelectasis is seen within the left lung base. There is a small right pleural effusion. No pneumothorax is identified. The heart size and  mediastinal contours are within normal limits. The visualized skeletal structures are unremarkable. IMPRESSION: 1. Mild left basilar linear atelectasis. 2. Small right pleural effusion. Electronically Signed   By: Virgina Norfolk M.D.   On: 07/18/2020 01:39    Microbiology No results found for this or any previous visit (from the past 240 hour(s)).  Lab Basic Metabolic Panel: No results for input(s): NA, K,  CL, CO2, GLUCOSE, BUN, CREATININE, CALCIUM, MG, PHOS in the last 168 hours. Liver Function Tests: No results for input(s): AST, ALT, ALKPHOS, BILITOT, PROT, ALBUMIN in the last 168 hours. No results for input(s): LIPASE, AMYLASE in the last 168 hours. No results for input(s): AMMONIA in the last 168 hours. CBC: No results for input(s): WBC, NEUTROABS, HGB, HCT, MCV, PLT in the last 168 hours. Cardiac Enzymes: No results for input(s): CKTOTAL, CKMB, CKMBINDEX, TROPONINI in the last 168 hours. Sepsis Labs: No results for input(s): PROCALCITON, WBC, LATICACIDVEN in the last 168 hours.  Procedures/Operations     Jonnie Finner 09/03/2020, 7:21 AM

## 2021-06-14 IMAGING — CT CT ABD-PELV W/ CM
2 of 5 series · 14 of 46 positions shown, 16 images · IV contrast (omnipaque)
Comparison: CT 04/30/2019

CLINICAL DATA: Abdominal distension, history of malignancy

EXAM:
CT ABDOMEN AND PELVIS WITH CONTRAST
TECHNIQUE: Multidetector CT imaging of the abdomen and pelvis was performed
using the standard protocol following bolus administration of
intravenous contrast.
CONTRAST:  100mL OMNIPAQUE IOHEXOL 300 MG/ML  SOLN

[Series 2: axial st · axial · 0.74mm/px · z∈[-491,+9]mm · 11 of 116 slices shown, 13 images]
[im 8/116  soft-tissue]
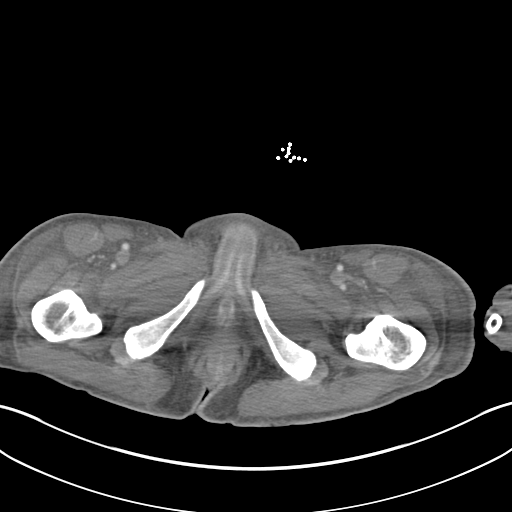
[im 8/116  bone]
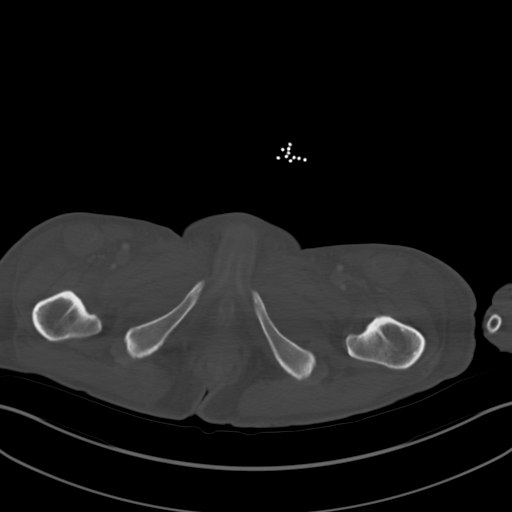
[im 16/116  soft-tissue]
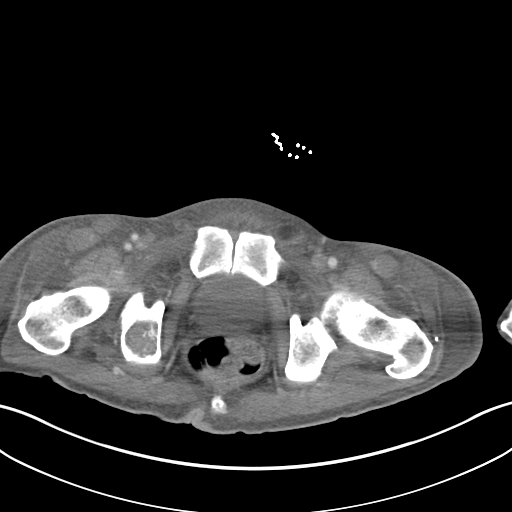
[im 31/116  soft-tissue]
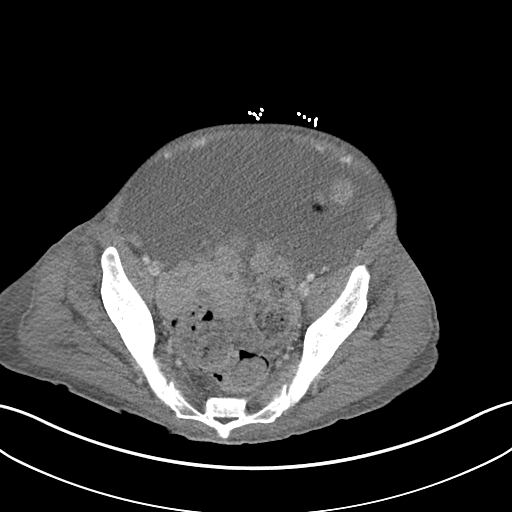
[im 39/116  soft-tissue]
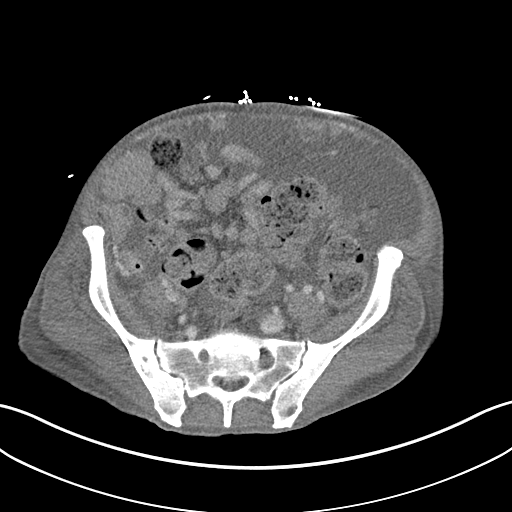
[im 47/116  soft-tissue]
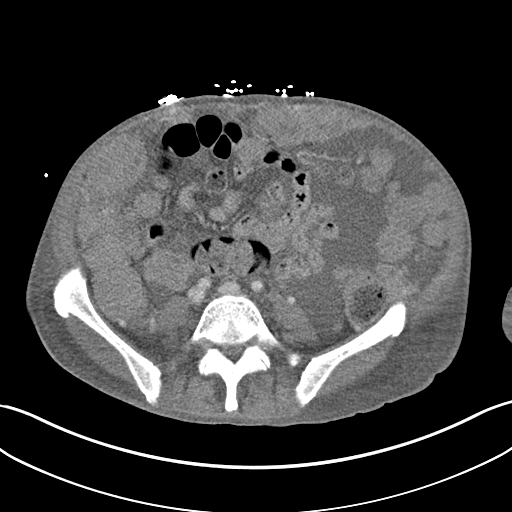
[im 62/116  soft-tissue]
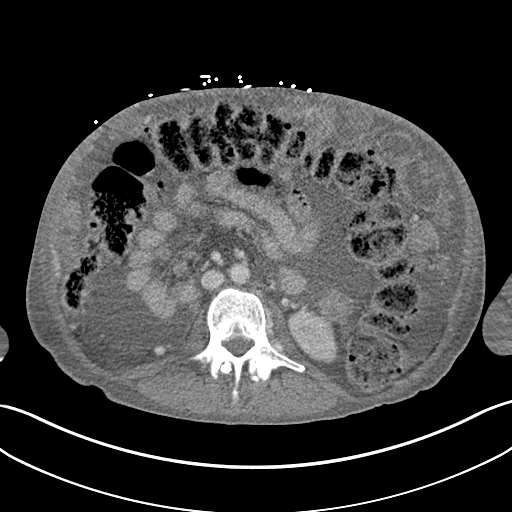
[im 70/116  soft-tissue]
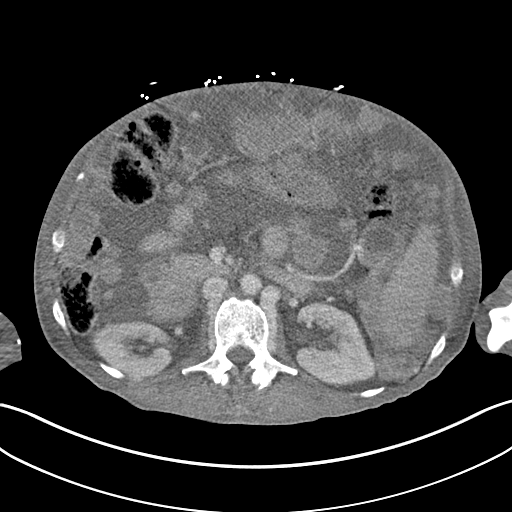
[im 77/116  soft-tissue]
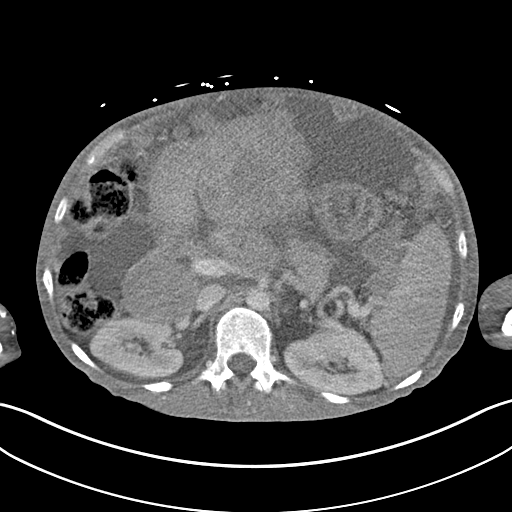
[im 85/116  soft-tissue]
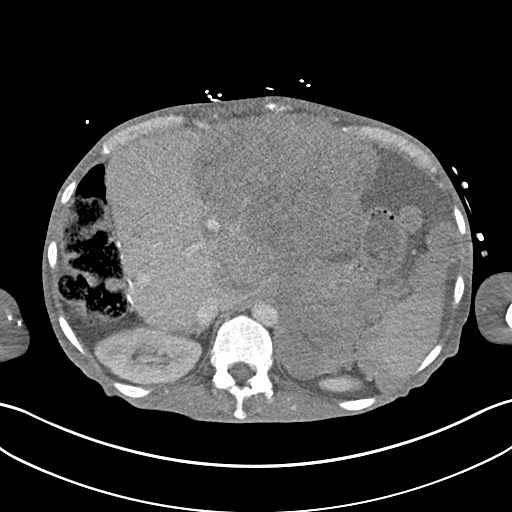
[im 85/116  bone]
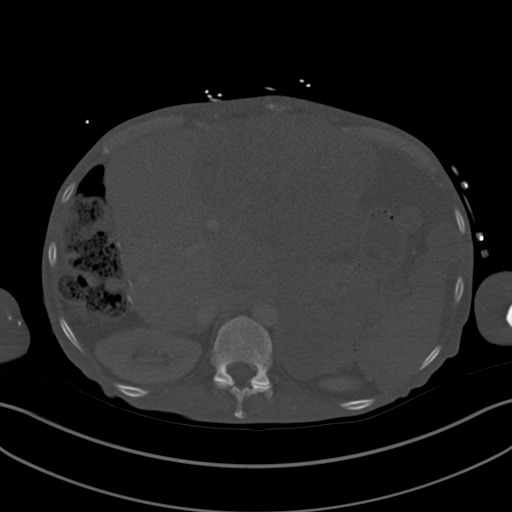
[im 100/116  soft-tissue]
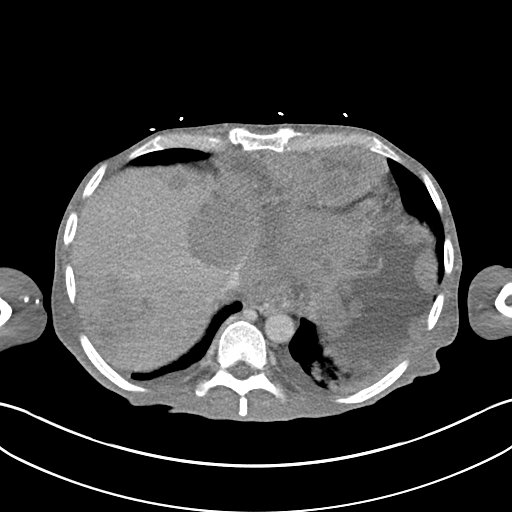
[im 108/116  soft-tissue]
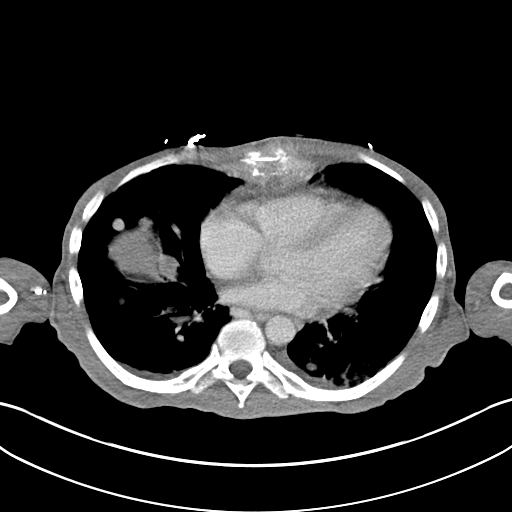

[Series 5: coronal st · coronal · 0.83mm/px · 3 of 151 slices shown]
[im 51/151  soft-tissue]
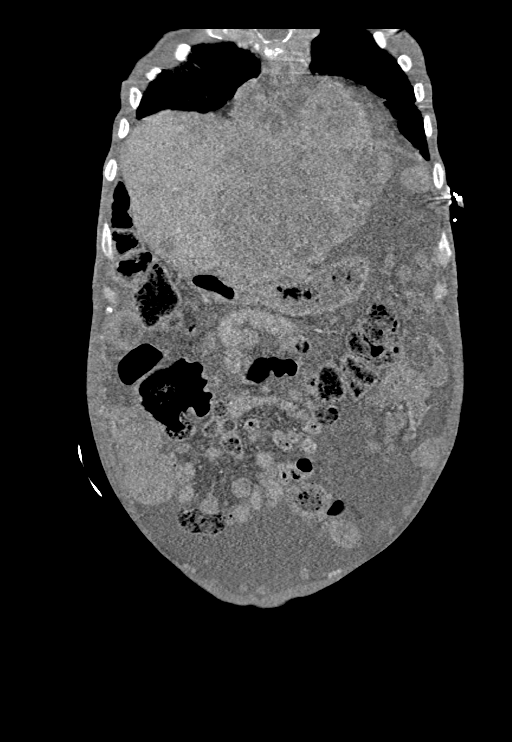
[im 67/151  soft-tissue]
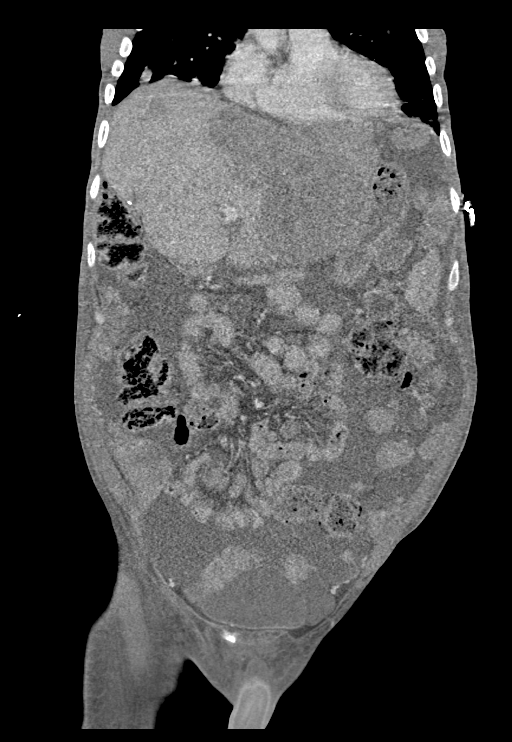
[im 84/151  soft-tissue]
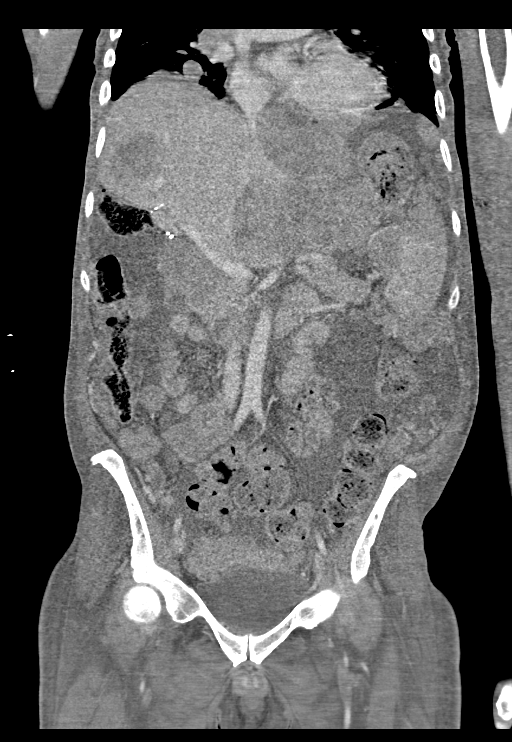

[14 of 46 positions shown; findings below may reference images not displayed]

FINDINGS: Lower chest: Numerous new pulmonary nodules present throughout both
lungs. Additionally, there are patchy areas of mixed ground-glass
and consolidative opacity in both lungs and bilateral pleural
effusions. Metastatic adenopathy noted in the mediastinum hilum.
Direct extension the hepatic lesions into the anterior
mediastinum/pre cardiac space.

Hepatobiliary: Postsurgical changes from prior partial right
hepatectomy. Significant interval progression of disease with
numerous new and enlarging heterogeneous metastatic lesions
throughout both the right and left lobe liver, largest lesion in the
right lobe measuring up to 8.3 cm. Large conglomerate of metastatic
foci in the left lobe measures up to 14 cm in maximum transaxial
dimension. Numerous additional smaller metastatic lesions seen
elsewhere in the liver including foci within the caudate as well.
Likely resulting mass effect upon both portal and hepatic veins.
Partial effacement of the IVC is well. Gallbladder appears
surgically absent.

Pancreas: No pancreatic ductal dilatation or surrounding
inflammatory changes.

Spleen: No clear splenic lesions are seen. Multiple adjacent
metastatic deposits are seen in the left subphrenic space however.

Adrenals/Urinary Tract: Large bilateral adrenal metastatic deposits
are present measuring up to 7.8 cm on the left and 5.7 cm on the
right. Kidneys are normally located with symmetric enhancement. No
suspicious renal lesion, urolithiasis or hydronephrosis. Urinary
bladder is unremarkable.

Stomach/Bowel: Mass effect of the numerous upper abdominal
metastatic foci resulting in some probable compression of the
gastric antrum. Distal stomach and duodenum are unremarkable. No
small bowel thickening or dilatation. Large colonic stool burden.
Appendix is not visualized.

Vascular/Lymphatic: Compression of the hepatic, portal veins and
supra hepatic IVC. Numerous metastatic deposits noted throughout the
abdomen pelvis including mesenteric and omental nodularity.

Reproductive: The prostate and seminal vesicles are unremarkable.

Other: Large volume abdominopelvic ascites. Numerous metastatic
deposits within the omentum and mesentery, largest omental deposits
seen in the left upper quadrant/subphrenic space. Additional
features of anasarca with diffuse circumferential body wall edema.

Musculoskeletal: Expansile, lytic appearing changes of the sternum
and xiphoid process. Adjacent soft tissue extension along the
anterior chest wall. Necrotic internal mammary adenopathy is noted
as well. Stable sclerotic/lytic lesion seen in the right ilium.
IMPRESSION: 1. Marked interval progression of disease with numerous new and
enlarging heterogeneous metastatic lesions throughout both the right
and left lobe liver. Likely resulting mass effect upon the hepatic,
portal veins and supra hepatic IVC. Partial extension into the
anterior mediastinum.
2. Numerous additional new metastatic deposits within the omentum
and mesentery, largest omental deposits in the left upper
quadrant/subphrenic space. Large volume likely malignant ascites.
3. New pulmonary and adrenal metastatic foci. Osseous metastatic
involvement of the sternum and xiphoid process significant soft
tissue extension into the anterior chest wall. Necrotic nodal
metastatic disease throughout the chest and abdomen.
4. Bilateral pleural effusions.
5. Patchy areas of mixed ground-glass and consolidative opacity in
both lungs and bilateral pleural effusions, could reflect a
superimposed infectious process as well.
6. Additional features of anasarca with diffuse circumferential body
wall edema.

## 2021-06-14 IMAGING — DX DG CHEST 1V PORT
1 series · 1 of 1 positions shown · non-contrast
Comparison: August 12, 2011

CLINICAL DATA: Shortness of breath and abdominal distension.

EXAM:
PORTABLE CHEST 1 VIEW

[chest ap]
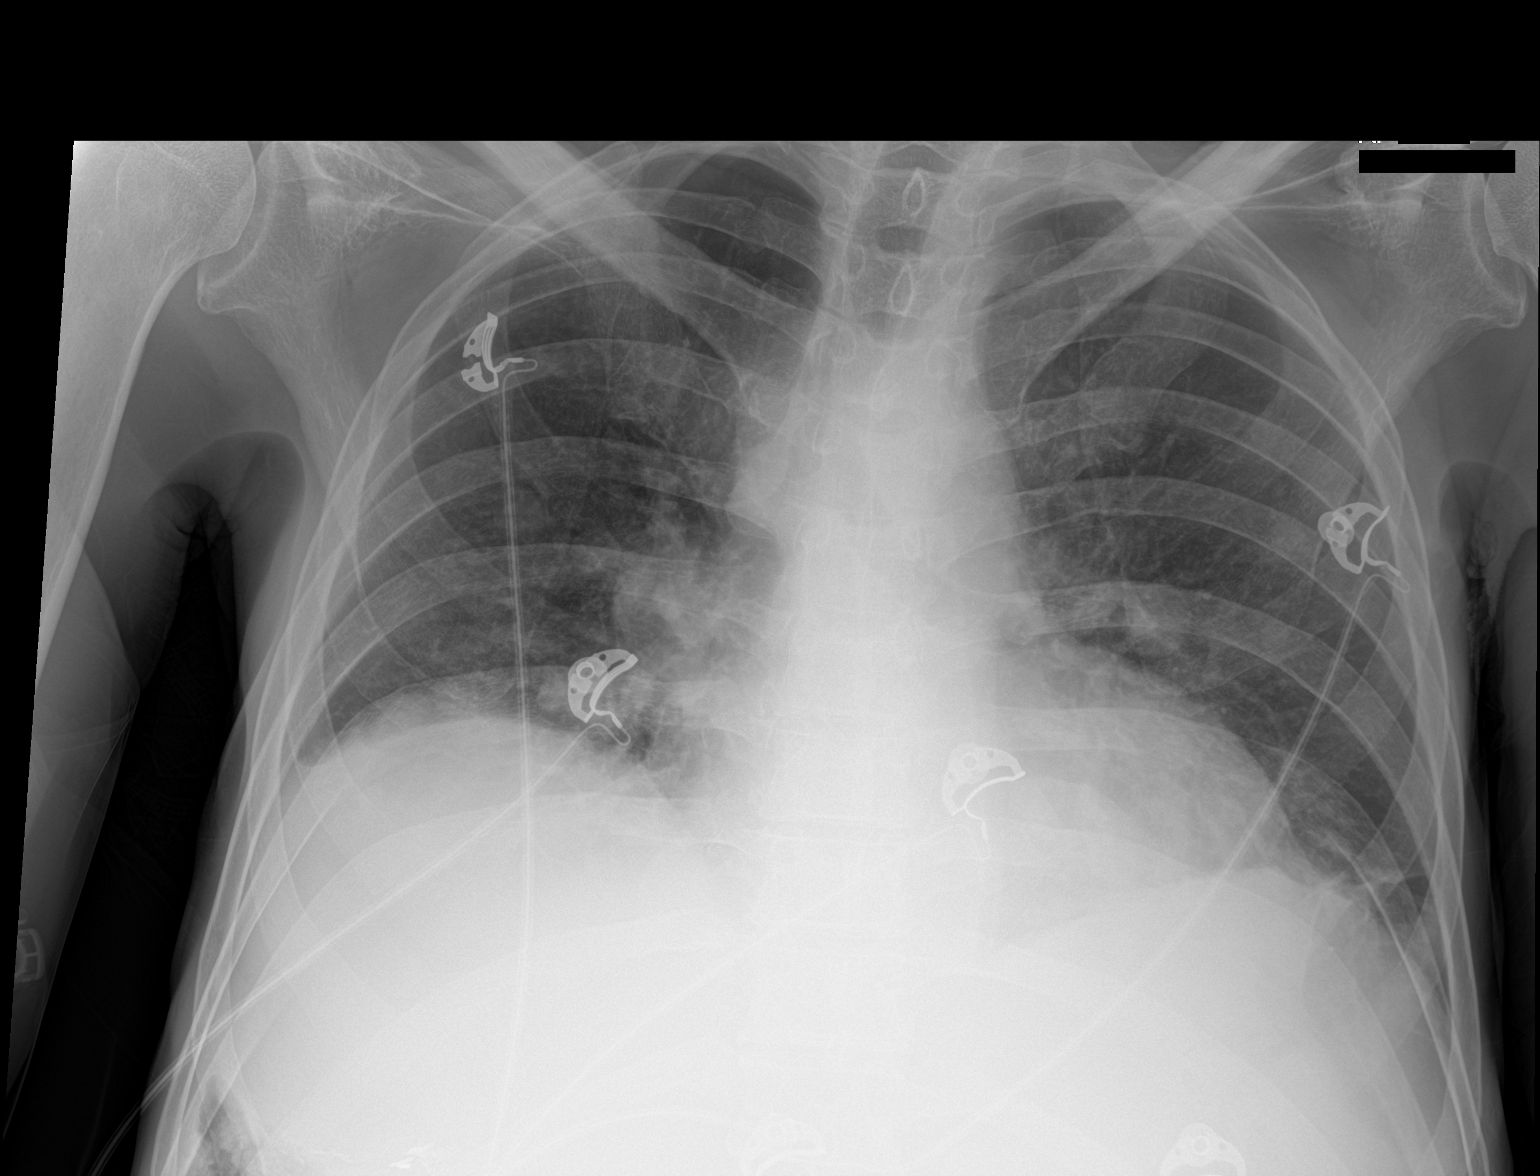

[1 of 1 positions shown; findings below may reference images not displayed]

FINDINGS: Decreased lung volumes are seen which is likely, in part, secondary
to suboptimal patient inspiration. Very mild linear atelectasis is
seen within the left lung base. There is a small right pleural
effusion. No pneumothorax is identified. The heart size and
mediastinal contours are within normal limits. The visualized
skeletal structures are unremarkable.
IMPRESSION: 1. Mild left basilar linear atelectasis.
2. Small right pleural effusion.
